# Patient Record
Sex: Female | Born: 1990
Health system: Southern US, Community
[De-identification: ages and names within clinical notes are randomized; demographics above are authoritative.]

## PROBLEM LIST (undated history)

## (undated) DIAGNOSIS — N912 Amenorrhea, unspecified: Secondary | ICD-10-CM

## (undated) DIAGNOSIS — N946 Dysmenorrhea, unspecified: Secondary | ICD-10-CM

## (undated) HISTORY — DX: Amenorrhea, unspecified: N91.2

## (undated) HISTORY — DX: Dysmenorrhea, unspecified: N94.6

---

## 2017-02-15 DIAGNOSIS — H109 Unspecified conjunctivitis: Secondary | ICD-10-CM | POA: Diagnosis not present

## 2018-02-17 ENCOUNTER — Telehealth: Payer: Self-pay | Admitting: Medical

## 2018-02-17 ENCOUNTER — Telehealth: Payer: Self-pay

## 2018-02-17 ENCOUNTER — Ambulatory Visit: Payer: BLUE CROSS/BLUE SHIELD | Admitting: Medical

## 2018-02-17 ENCOUNTER — Encounter: Payer: Self-pay | Admitting: Medical

## 2018-02-17 VITALS — BP 121/73 | HR 85 | Temp 98.2°F | Resp 16 | Ht 68.0 in | Wt 260.0 lb

## 2018-02-17 DIAGNOSIS — Z3009 Encounter for other general counseling and advice on contraception: Secondary | ICD-10-CM | POA: Diagnosis not present

## 2018-02-17 DIAGNOSIS — L732 Hidradenitis suppurativa: Secondary | ICD-10-CM

## 2018-02-17 LAB — POCT URINE PREGNANCY: Preg Test, Ur: NEGATIVE

## 2018-02-17 MED ORDER — SULFAMETHOXAZOLE-TRIMETHOPRIM 800-160 MG PO TABS: 1.0000 | ORAL_TABLET | Freq: Two times a day (BID) | ORAL | 0 refills | Status: DC

## 2018-02-17 MED ORDER — MUPIROCIN 2 % EX OINT: TOPICAL_OINTMENT | CUTANEOUS | 0 refills | Status: DC

## 2018-02-17 MED ORDER — NORETHINDRONE-ETH ESTRADIOL 1-35 MG-MCG PO TABS: 1.0000 | ORAL_TABLET | Freq: Every day | ORAL | 11 refills | Status: DC

## 2018-02-17 NOTE — Patient Instructions (Signed)
For family planning/desire to be on contraceptives, I did prescribe Ortho Novum.  Your blood pressure is well controlled today, you do not smoke and no history of any DVTs.  Extra strongly advised not to start smoking as sometimes this can predispose to getting DVTs.  I will go ahead and refer you to gynecologist for Pap smear.  You expressed some concern for clinical picture of PCOS.  Gynecologist may go ahead and do ultrasound when Pap smear done.  History of hidradenitis supparativa.  Small area appears present left axillary area presently.  I will prescribe Bactrim DS  Oral antibiotic for 7 days.  Did go ahead and write you for mupirocin  topical antibiotic to use for future flares early on.  Would asked that you go ahead and schedule for complete physical exam with me and come in fasting for 8 hours.  Recommend early 8 AM appointment.  You could try to get that scheduled within the next 2 to 3 weeks.

## 2018-02-17 NOTE — Telephone Encounter (Signed)
Copied from CRM [id number]. Topic: General - Other >> Feb 17, 2018  8:33 AM Burchel, Abbi R wrote: Reason for CRM: Pt states she is runnign 5-7 min late dur to traffic.

## 2018-02-17 NOTE — Addendum Note (Signed)
Addended by: Orlene OchRENCE, [place] N on: 02/17/2018 11:02 AM   Modules accepted: Orders

## 2018-02-17 NOTE — Progress Notes (Signed)
Subjective:    Patient ID: Kathleen Harding, female    DOB: Jul 07, 1990, 27 y.o.   MRN: [id number]  HPI  Pt in for first time.  Pt works Marketing executive x 4 years. Pt does not exercise. No caffeine. Admits does not eat healthy. Non smoker. Alcohol 2 drinks of wine every 2 weeks.  Pt does have h supparativa. States happens 2-3 times a month. When on birth control when younger had less flares. Pt recently has small broken down area left axillary area. Small bump over past week.  LMP- had 2 months. She does have history of irregular cycles. Hx of cycles every 2-3 months in past.(Pt states not sexually active presently. But has been before). She request to be on ocp. No history of DVT. Non smoker. No hx of htn.  Pt states she had pap smear more than 3 years ago. She can't remember if it was normal.     Review of Systems  Constitutional: Negative for chills, fatigue and fever.  HENT: Negative for congestion, drooling, facial swelling, mouth sores, postnasal drip, rhinorrhea and sinus pain.   Respiratory: Negative for cough, chest tightness, shortness of breath and wheezing.   Cardiovascular: Negative for chest pain and palpitations.  Gastrointestinal: Negative for abdominal pain.  Genitourinary: Negative for difficulty urinating, dysuria, frequency, hematuria and urgency.  Musculoskeletal: Negative for back pain, joint swelling and myalgias.  Skin:       See hpi.  Neurological: Negative for dizziness, weakness, light-headedness, numbness and headaches.  Hematological: Negative for adenopathy. Does not bruise/bleed easily.  Psychiatric/Behavioral: Negative for behavioral problems, confusion and dysphoric mood.    No past medical history on file.   Social History   Socioeconomic History  . Marital status: Single    Spouse name: Not on file  . Number of children: Not on file  . Years of education: Not on file  . Highest education level: Not on file  Occupational History  . Not on file    Social Needs  . Financial resource strain: Not on file  . Food insecurity:    Worry: Not on file    Inability: Not on file  . Transportation needs:    Medical: Not on file    Non-medical: Not on file  Tobacco Use  . Smoking status: Never Smoker  . Smokeless tobacco: Never Used  Substance and Sexual Activity  . Alcohol use: Yes  . Drug use: Never  . Sexual activity: Not on file  Lifestyle  . Physical activity:    Days per week: Not on file    Minutes per session: Not on file  . Stress: Not on file  Relationships  . Social connections:    Talks on phone: Not on file    Gets together: Not on file    Attends religious service: Not on file    Active member of club or organization: Not on file    Attends meetings of clubs or organizations: Not on file    Relationship status: Not on file  . Intimate partner violence:    Fear of current or ex partner: Not on file    Emotionally abused: Not on file    Physically abused: Not on file    Forced sexual activity: Not on file  Other Topics Concern  . Not on file  Social History Narrative  . Not on file      No family history on file.  Not on File  No current outpatient medications on file prior  to visit.   No current facility-administered medications on file prior to visit.     There were no vitals taken for this visit.      Objective:   Physical Exam  General Mental Status- Alert. General Appearance- Not in acute distress.   Skin Left axillary area. Small raised are present. Central portion 6 mm area of epidermis broken down. No dc presently. Faint indurated.  Neck Carotid Arteries- Normal color. Moisture- Normal Moisture. No carotid bruits. No JVD.  Chest and Lung Exam Auscultation: Breath Sounds:-Normal.  Cardiovascular Auscultation:Rythm- Regular. Murmurs & Other Heart Sounds:Auscultation of the heart reveals- No Murmurs.  Abdomen Inspection:-Inspeection Normal. Palpation/Percussion:Note:No mass.  Palpation and Percussion of the abdomen reveal- Non Tender, Non Distended + BS, no rebound or guarding.   Neurologic Cranial Nerve exam:- CN III-XII intact(No nystagmus), symmetric smile. Strength:- 5/5 equal and symmetric strength both upper and lower extremities.      Assessment & Plan:  For family planning/desire to be on contraceptives, I did prescribe Ortho Novum.  Your blood pressure is well controlled today, you do not smoke and no history of any DVTs.  Extra strongly advised not to start smoking as sometimes this can predispose to getting DVTs.  I will go ahead and refer you to gynecologist for Pap smear.  You expressed some concern for clinical picture of PCOS.  Gynecologist may go ahead and do ultrasound when Pap smear done.  History of hidradenitis supparativa.  Small area appears present left axillary area presently.  I will prescribe Bactrim DS  Oral antibiotic for 7 days.  Did go ahead and write you for mupirocin  topical antibiotic to use for future flares early on.  Would asked that you go ahead and schedule for complete physical exam with me and come in fasting for 8 hours.  Recommend early 8 AM appointment.  You could try to get that scheduled within the next 2 to 3 weeks.  Esperanza RichtersEdward Bensyn Bornemann, PA-C

## 2018-02-17 NOTE — Telephone Encounter (Signed)
Please result pt negative urine pregnancy

## 2018-04-07 ENCOUNTER — Encounter: Payer: Self-pay | Admitting: *Deleted

## 2018-04-07 ENCOUNTER — Ambulatory Visit (INDEPENDENT_AMBULATORY_CARE_PROVIDER_SITE_OTHER): Payer: BLUE CROSS/BLUE SHIELD | Admitting: Family Medicine

## 2018-04-07 ENCOUNTER — Encounter: Payer: Self-pay | Admitting: Family Medicine

## 2018-04-07 VITALS — BP 122/80 | HR 81 | Temp 98.4°F | Ht 68.0 in | Wt 254.8 lb

## 2018-04-07 DIAGNOSIS — K529 Noninfective gastroenteritis and colitis, unspecified: Secondary | ICD-10-CM | POA: Diagnosis not present

## 2018-04-07 DIAGNOSIS — R197 Diarrhea, unspecified: Secondary | ICD-10-CM | POA: Diagnosis not present

## 2018-04-07 DIAGNOSIS — R112 Nausea with vomiting, unspecified: Secondary | ICD-10-CM | POA: Diagnosis not present

## 2018-04-07 MED ORDER — ONDANSETRON 4 MG PO TBDP: 4.0000 mg | ORAL_TABLET | Freq: Three times a day (TID) | ORAL | 0 refills | Status: DC | PRN

## 2018-04-07 NOTE — Progress Notes (Signed)
HPI:  Using dictation device. Unfortunately this device frequently misinterprets words/phrases.  Acute visit for N/V: -started acutely today -waves of nausea with emesis (nonbilious, non bloody) x 2 -one episode watery to loose diarrhea - ? Streak of blood on tp - no large amount of blood in toilet or stool or TP -no treatment -no recent travel, different foods, fevers, abd pain, SOB, dizziness -no improving -FDLMP 03/22/18  ROS: See pertinent positives and negatives per HPI.  History reviewed. No pertinent past medical history.  History reviewed. No pertinent surgical history.  History reviewed. No pertinent family history.  SOCIAL HX: see hpi   Current Outpatient Medications:  .  mupirocin ointment (BACTROBAN) 2 %, Apply thin film twice daily, Disp: 22 g, Rfl: 0 .  norethindrone-ethinyl estradiol 1/35 (ORTHO-NOVUM, NORTREL,CYCLAFEM) tablet, Take 1 tablet by mouth daily., Disp: 1 Package, Rfl: 11  EXAM:  Vitals:   04/07/18 1451  BP: 122/80  Pulse: 81  Temp: 98.4 F (36.9 C)  SpO2: 98%    Body mass index is 38.74 kg/m.  GENERAL: vitals reviewed and listed above, alert, oriented, appears well hydrated and in no acute distress  HEENT: atraumatic, conjunttiva clear, no obvious abnormalities on inspection of external nose and ears  NECK: no obvious masses on inspection  LUNGS: clear to auscultation bilaterally, no wheezes, rales or rhonchi, good air movement  CV: HRRR, no peripheral edema  ABD: BS+ , soft, NTTP, no rebound or guarding  MS: moves all extremities without noticeable abnormality  PSYCH: pleasant and cooperative, no obvious depression or anxiety  ASSESSMENT AND PLAN:  Discussed the following assessment and plan:  Gastroenteritis  Nausea and vomiting, intractability of vomiting not specified, unspecified vomiting type  Diarrhea, unspecified type  -we discussed possible serious and likely etiologies, workup and treatment, treatment risks and  return precautions - suspect mild gastroenteritis -after this discussion, Brittinie opted for symptomatic care, oral rehydration -follow up advised as needed -of course, we advised Weslynn  to return or notify a doctor immediately if symptoms worsen or persist or new concerns arise. She is to seek care promptly if any bleeding.    Patient Instructions  BEFORE YOU LEAVE: -work note - do not return to work until no vomiting or diarrhea for 24 hours   imodium per instructions as needed for diarrhea.  zofran per instructions as needed for nausea and vomiting.  I hope you are feeling better soon! Seek care promptly if your symptoms worsen, new concerns arise or you are not improving with treatment.   Viral Gastroenteritis, Adult  Viral gastroenteritis is also known as the stomach flu. This condition is caused by certain germs (viruses). These germs can be passed from person to person very easily (are very contagious). This condition can cause sudden watery poop (diarrhea), fever, and throwing up (vomiting). Having watery poop and throwing up can make you feel weak and cause you to get dehydrated. Dehydration can make you tired and thirsty, make you have a dry mouth, and make it so you pee (urinate) less often. Older adults and people with other diseases or a weak defense system (immune system) are at higher risk for dehydration. It is important to replace the fluids that you lose from having watery poop and throwing up. Follow these instructions at home: Follow instructions from your doctor about how to care for yourself at home. Eating and drinking Follow these instructions as told by your doctor:  Take an oral rehydration solution (ORS). This is a drink that is  sold at pharmacies and stores.  Drink clear fluids in small amounts as you are able, such as: ? Water. ? Ice chips. ? Diluted fruit juice. ? Low-calorie sports drinks.  Eat bland, easy-to-digest foods in small amounts as you are  able, such as: ? Bananas. ? Applesauce. ? Rice. ? Low-fat (lean) meats. ? Toast. ? Crackers.  Avoid fluids that have a lot of sugar or caffeine in them.  Avoid alcohol.  Avoid spicy or fatty foods. General instructions   Drink enough fluid to keep your pee (urine) clear or pale yellow.  Wash your hands often. If you cannot use soap and water, use hand sanitizer.  Make sure that all people in your home wash their hands well and often.  Rest at home while you get better.  Take over-the-counter and prescription medicines only as told by your doctor.  Watch your condition for any changes.  Take a warm bath to help with any burning or pain from having watery poop.  Keep all follow-up visits as told by your doctor. This is important. Contact a doctor if:  You cannot keep fluids down.  Your symptoms get worse.  You have new symptoms.  You feel light-headed or dizzy.  You have muscle cramps. Get help right away if:  You have chest pain.  You feel very weak or you pass out (faint).  You see blood in your throw-up.  Your throw-up looks like coffee grounds.  You have bloody or black poop (stools) or poop that look like tar.  You have a very bad headache, a stiff neck, or both.  You have a rash.  You have very bad pain, cramping, or bloating in your belly (abdomen).  You have trouble breathing.  You are breathing very quickly.  Your heart is beating very quickly.  Your skin feels cold and clammy.  You feel confused.  You have pain when you pee.  You have signs of dehydration, such as: ? Dark pee, hardly any pee, or no pee. ? Cracked lips. ? Dry mouth. ? Sunken eyes. ? Sleepiness. ? Weakness. This information is not intended to replace advice given to you by your health care provider. Make sure you discuss any questions you have with your health care provider. Document Released: 08/14/2007 Document Revised: 11/19/2017 Document Reviewed:  11/01/2014 Elsevier Interactive Patient Education  2019 [employer]    Terressa KoyanagiHannah R Marguarite Markov, DO

## 2018-04-07 NOTE — Patient Instructions (Signed)
BEFORE YOU LEAVE: -work note - do not return to work until no vomiting or diarrhea for 24 hours   imodium per instructions as needed for diarrhea.  zofran per instructions as needed for nausea and vomiting.  I hope you are feeling better soon! Seek care promptly if your symptoms worsen, new concerns arise or you are not improving with treatment.   Viral Gastroenteritis, Adult  Viral gastroenteritis is also known as the stomach flu. This condition is caused by certain germs (viruses). These germs can be passed from person to person very easily (are very contagious). This condition can cause sudden watery poop (diarrhea), fever, and throwing up (vomiting). Having watery poop and throwing up can make you feel weak and cause you to get dehydrated. Dehydration can make you tired and thirsty, make you have a dry mouth, and make it so you pee (urinate) less often. Older adults and people with other diseases or a weak defense system (immune system) are at higher risk for dehydration. It is important to replace the fluids that you lose from having watery poop and throwing up. Follow these instructions at home: Follow instructions from your doctor about how to care for yourself at home. Eating and drinking Follow these instructions as told by your doctor:  Take an oral rehydration solution (ORS). This is a drink that is sold at pharmacies and stores.  Drink clear fluids in small amounts as you are able, such as: ? Water. ? Ice chips. ? Diluted fruit juice. ? Low-calorie sports drinks.  Eat bland, easy-to-digest foods in small amounts as you are able, such as: ? Bananas. ? Applesauce. ? Rice. ? Low-fat (lean) meats. ? Toast. ? Crackers.  Avoid fluids that have a lot of sugar or caffeine in them.  Avoid alcohol.  Avoid spicy or fatty foods. General instructions   Drink enough fluid to keep your pee (urine) clear or pale yellow.  Wash your hands often. If you cannot use soap and  water, use hand sanitizer.  Make sure that all people in your home wash their hands well and often.  Rest at home while you get better.  Take over-the-counter and prescription medicines only as told by your doctor.  Watch your condition for any changes.  Take a warm bath to help with any burning or pain from having watery poop.  Keep all follow-up visits as told by your doctor. This is important. Contact a doctor if:  You cannot keep fluids down.  Your symptoms get worse.  You have new symptoms.  You feel light-headed or dizzy.  You have muscle cramps. Get help right away if:  You have chest pain.  You feel very weak or you pass out (faint).  You see blood in your throw-up.  Your throw-up looks like coffee grounds.  You have bloody or black poop (stools) or poop that look like tar.  You have a very bad headache, a stiff neck, or both.  You have a rash.  You have very bad pain, cramping, or bloating in your belly (abdomen).  You have trouble breathing.  You are breathing very quickly.  Your heart is beating very quickly.  Your skin feels cold and clammy.  You feel confused.  You have pain when you pee.  You have signs of dehydration, such as: ? Dark pee, hardly any pee, or no pee. ? Cracked lips. ? Dry mouth. ? Sunken eyes. ? Sleepiness. ? Weakness. This information is not intended to replace advice given to  you by your health care provider. Make sure you discuss any questions you have with your health care provider. Document Released: 08/14/2007 Document Revised: 11/19/2017 Document Reviewed: 11/01/2014 Elsevier Interactive Patient Education  2019 [employer]

## 2018-07-10 ENCOUNTER — Other Ambulatory Visit: Payer: Self-pay | Admitting: Medical

## 2018-07-29 ENCOUNTER — Other Ambulatory Visit: Payer: Self-pay

## 2018-07-29 ENCOUNTER — Ambulatory Visit (INDEPENDENT_AMBULATORY_CARE_PROVIDER_SITE_OTHER): Payer: BLUE CROSS/BLUE SHIELD | Admitting: Medical

## 2018-07-29 ENCOUNTER — Encounter: Payer: Self-pay | Admitting: Medical

## 2018-07-29 ENCOUNTER — Telehealth: Payer: Self-pay | Admitting: Medical

## 2018-07-29 DIAGNOSIS — R51 Headache: Secondary | ICD-10-CM | POA: Diagnosis not present

## 2018-07-29 DIAGNOSIS — R519 Headache, unspecified: Secondary | ICD-10-CM

## 2018-07-29 LAB — SEDIMENTATION RATE: Sed Rate: 24 mm/hr — ABNORMAL HIGH (ref 0–20)

## 2018-07-29 MED ORDER — KETOROLAC TROMETHAMINE 60 MG/2ML IM SOLN
60.0000 mg | Freq: Once | INTRAMUSCULAR | Status: AC
  Administered 2018-07-29: 14:00:00 60 mg via INTRAMUSCULAR

## 2018-07-29 MED ORDER — PREDNISONE 10 MG (21) PO TBPK: ORAL_TABLET | ORAL | 0 refills | Status: DC

## 2018-07-29 NOTE — Patient Instructions (Addendum)
Patient has headache recently with most of her pain described in the temporal area right side.  She has good neurologic exam on today's a video visit.  Disadvantage in that I cannot look inside her right ear canal.  Often unable to view dentition/teeth.  No obvious recent allergy symptoms and no proceeding symptoms indicating sinus infection.  That this is in the differential.  Also considered possible early shingles and she does have some pain trigeminal distribution.  Presently advised patient to come in at 145 this afternoon for a stat sed rate.  Nurse will check patient's blood pressure.  If blood pressure is reasonably controlled will probably give Toradol 30 or 60 mg IM injection.  Plan to follow the sed rate and if it is elevated will prescribe prednisone.(also considering migaine in diff dx as some light sensitivity)  If sed rate is not elevated then will assess patient's response to the Toradol.  If she has worsening or changing signs symptoms and will advise emergency department evaluation.  She does have some improvement but not complete improvement then might prescribe Fioricet.  Did explain the patient to watch for any rash or vesicle eruption on her face.(in event of skin erupution would rx antiviral)    If she has worsening ear pain or sinus region pain might consider even antibiotic.  Follow-up in 7 days or as needed.

## 2018-07-29 NOTE — Telephone Encounter (Signed)
Rx prednisone sent to pt pharmacy. 

## 2018-07-29 NOTE — Addendum Note (Signed)
Addended by: Orlene Och on: 07/29/2018 02:03 PM   Modules accepted: Orders

## 2018-07-29 NOTE — Progress Notes (Signed)
Subjective:    Patient ID: Kathleen Harding, female    DOB: Dec 31, 1990, 28 y.o.   MRN: [id number]  HPI  Virtual Visit via Video Note  I connected with Kathleen Harding on 07/29/18 at 11:20 AM EDT by a video enabled telemedicine application and verified that I am speaking with the correct person using two identifiers.  Location: Patient: home Provider: home.   I discussed the limitations of evaluation and management by telemedicine and the availability of in person appointments. The patient expressed understanding and agreed to proceed.  LMP- 07-18-2018.  Patient did not check her blood pressure today.  She does not have electronic blood pressure cuff.   History of Present Illness: Patient reports 3 days of right-sided headache.  She reports some pain in her temporal area, right side of neck/trapezius area and the jaw.  She also reports some mild ear pain and sinus region pain.  The pain is mostly in the temporal area.  She is not having any rashes or blister outbreak on her face.  She does have some slight light sensitivity.  No history of any chronic headaches in the past.  No history of any migraines.  No obvious allergy symptoms recently except for occasional sneeze.  She reports some faint pain in her right upper and lower teeth.  She describes that she has good dentition.  No obvious caries.  She has a little bit of pain in her teeth even when she is not chewing.   Observations/Objective: General no acute distress.  Pleasant.  Speech is normal.  Oriented. Lungs- breathing appears unlabored. Neuro-cranial nerves III through XII grossly intact.  No gross motor deficits on viewing patient performed movements.  She has good finger-to-nose.  Symmetric smile.  No drift.  Negative Homans sign.  Heel-to-toe gait intact. Skin-on inspection of her face it looks normal.  There is no rash or vesicles on her face. HEENT- she reports pain tenderness to palpation over the maxillary sinus area.  Also just  right of the bridge of her nose faintly tender.  Upon palpation of her right nares she does not report pain.  Right temporal area moderately tender to palpation when she presses her finger against area.  Assessment and Plan: Patient has headache recently with most of her pain described in the temporal area right side.  She has good neurologic exam on today's a video visit.  Disadvantage in that I cannot look inside her right ear canal.  Often unable to view dentition/teeth.  No obvious recent allergy symptoms and no proceeding symptoms indicating sinus infection.  That this is in the differential.  Also considered possible early shingles and she does have some pain trigeminal distribution.  Presently advised patient to come in at 145 this afternoon for a stat sed rate.  Nurse will check patient's blood pressure.  If blood pressure is reasonably controlled will probably give Toradol 30 or 60 mg IM injection.  Plan to follow the sed rate and if it is elevated will prescribe prednisone.  If sed rate is not elevated then will assess patient's response to the Toradol.  If she has worsening or changing signs symptoms and will advise emergency department evaluation.  She does have some improvement but not complete improvement then might prescribe Fioricet.  Did explain the patient to watch for any rash or vesicle eruption on her face.(in event of skin erupution would rx antiviral)    If she has worsening ear pain or sinus region pain might consider even  antibiotic.  Follow-up in 7 days or as needed.  25 minutes spent with patient.  50% of time counseling patient on plan going forward and differential diagnosis.  Answered patient's questions.  Follow Up Instructions:    I discussed the assessment and treatment plan with the patient. The patient was provided an opportunity to ask questions and all were answered. The patient agreed with the plan and demonstrated an understanding of the instructions.    The patient was advised to call back or seek an in-person evaluation if the symptoms worsen or if the condition fails to improve as anticipated.     Esperanza RichtersEdward Feliz Lincoln, PA-C   Review of Systems  Constitutional: Negative for chills and fever.  HENT: Positive for sinus pressure and sore throat. Negative for congestion, ear pain, facial swelling, nosebleeds, postnasal drip and trouble swallowing.   Eyes: Positive for photophobia. Negative for pain and visual disturbance.  Respiratory: Negative for cough, chest tightness, shortness of breath and wheezing.   Cardiovascular: Negative for chest pain and palpitations.  Gastrointestinal: Negative for abdominal pain.  Musculoskeletal: Negative for back pain, gait problem, myalgias and neck stiffness.  Skin: Negative for rash and wound.  Neurological: Positive for light-headedness. Negative for dizziness, tremors and weakness.  Hematological: Negative for adenopathy. Does not bruise/bleed easily.  Psychiatric/Behavioral: Negative for behavioral problems and confusion. The patient is not nervous/anxious and is not hyperactive.        Objective:   Physical Exam        Assessment & Plan:

## 2018-07-31 ENCOUNTER — Encounter: Payer: Self-pay | Admitting: Medical

## 2018-11-04 ENCOUNTER — Other Ambulatory Visit: Payer: Self-pay | Admitting: Medical

## 2018-11-11 ENCOUNTER — Other Ambulatory Visit: Payer: Self-pay

## 2018-11-13 ENCOUNTER — Encounter: Payer: Self-pay | Admitting: Family Medicine

## 2018-11-13 ENCOUNTER — Other Ambulatory Visit (HOSPITAL_COMMUNITY)
Admission: RE | Admit: 2018-11-13 | Discharge: 2018-11-13 | Disposition: A | Discharge status: Home / Self Care | Payer: BC Managed Care – PPO | Source: Ambulatory Visit | Attending: Family Medicine | Admitting: Family Medicine

## 2018-11-13 ENCOUNTER — Other Ambulatory Visit: Payer: Self-pay

## 2018-11-13 ENCOUNTER — Ambulatory Visit: Payer: BLUE CROSS/BLUE SHIELD | Admitting: Medical

## 2018-11-13 ENCOUNTER — Ambulatory Visit (INDEPENDENT_AMBULATORY_CARE_PROVIDER_SITE_OTHER): Payer: BC Managed Care – PPO | Admitting: Family Medicine

## 2018-11-13 VITALS — BP 110/80 | HR 82 | Temp 97.9°F | Resp 18 | Ht 68.0 in | Wt 246.2 lb

## 2018-11-13 DIAGNOSIS — Z113 Encounter for screening for infections with a predominantly sexual mode of transmission: Secondary | ICD-10-CM

## 2018-11-13 NOTE — Patient Instructions (Signed)

## 2018-11-13 NOTE — Progress Notes (Signed)
Patient ID: Kathleen Harding, female    DOB: 1991/02/25  Age: 28 y.o. MRN: [id number]    Subjective:  Subjective  HPI Kathleen Harding presents for std testing.  She is having no symptoms   No uti symptoms  No vaginal d/c , sores.   No abd pain   Review of Systems  Constitutional: Negative for appetite change, diaphoresis, fatigue and unexpected weight change.  Eyes: Negative for pain, redness and visual disturbance.  Respiratory: Negative for cough, chest tightness, shortness of breath and wheezing.   Cardiovascular: Negative for chest pain, palpitations and leg swelling.  Endocrine: Negative for cold intolerance, heat intolerance, polydipsia, polyphagia and polyuria.  Genitourinary: Negative for difficulty urinating, dysuria and frequency.  Neurological: Negative for dizziness, light-headedness, numbness and headaches.    History No past medical history on file.  She has no past surgical history on file.   Her family history is not on file.She reports that she has never smoked. She has never used smokeless tobacco. She reports current alcohol use. She reports that she does not use drugs.  Current Outpatient Medications on File Prior to Visit  Medication Sig Dispense Refill  . mupirocin ointment (BACTROBAN) 2 % Apply thin film twice daily 22 g 0  . ALAYCEN 1/35 tablet TAKE 1 TABLET BY MOUTH EVERY DAY (Patient not taking: Reported on 11/13/2018) 84 tablet 3   No current facility-administered medications on file prior to visit.      Objective:  Objective  Physical Exam Vitals signs and nursing note reviewed.  Constitutional:      General: She is not in acute distress.    Appearance: Normal appearance. She is obese. She is not ill-appearing, toxic-appearing or diaphoretic.  Neurological:     Mental Status: She is alert.  Psychiatric:        Mood and Affect: Mood normal.        Behavior: Behavior normal.        Thought Content: Thought content normal.    BP 110/80 (BP Location:  Right Arm, Patient Position: Sitting, Cuff Size: Normal)   Pulse 82   Temp 97.9 F (36.6 C) (Temporal)   Resp 18   Ht 5\' 8"  (1.727 m)   Wt 246 lb 3.2 oz (111.7 kg)   LMP 10/26/2018   SpO2 100%   BMI 37.43 kg/m  Wt Readings from Last 3 Encounters:  11/13/18 246 lb 3.2 oz (111.7 kg)  04/07/18 254 lb 12.8 oz (115.6 kg)  02/17/18 260 lb (117.9 kg)     No results found for: WBC, HGB, HCT, PLT, GLUCOSE, CHOL, TRIG, HDL, LDLDIRECT, LDLCALC, ALT, AST, NA, K, CL, CREATININE, BUN, CO2, TSH, PSA, INR, GLUF, HGBA1C, MICROALBUR  Patient was never admitted.   Assessment & Plan:  Plan  I have discontinued Haidy L. Sturgess's ondansetron and predniSONE. I am also having her maintain her mupirocin ointment and ALAYCEN 1/35.  No orders of the defined types were placed in this encounter.   Problem List Items Addressed This Visit    None    Visit Diagnoses    Screening examination for STD (sexually transmitted disease)    -  Primary   Relevant Orders   Urine cytology ancillary only([place])   HIV antibody (with reflex)   RPR   HSV Type I/II IgG, IgMw/ reflex   Hepatitis C antibody   Hepatitis B surface antibody,quantitative    std screening ordered avs given to pt  rto prn   Follow-up: Return if symptoms  worsen or fail to improve.  Ann Held, DO

## 2018-11-14 LAB — URINE CYTOLOGY ANCILLARY ONLY
Chlamydia: NEGATIVE
Neisseria Gonorrhea: NEGATIVE
Trichomonas: NEGATIVE

## 2018-11-17 LAB — HEPATITIS B SURFACE ANTIBODY, QUANTITATIVE: Hep B S AB Quant (Post): <5 m[IU]/mL — ABNORMAL LOW (ref >=10)

## 2018-11-17 LAB — HEPATITIS C ANTIBODY
Hepatitis C Ab: NONREACTIVE
SIGNAL TO CUT-OFF: 0.52 (ref <1.00)

## 2018-11-17 LAB — HIV ANTIBODY (ROUTINE TESTING W REFLEX): HIV 1&2 Ab, 4th Generation: NONREACTIVE

## 2018-11-17 LAB — RPR: RPR Ser Ql: NONREACTIVE

## 2018-11-18 LAB — HSV TYPE I/II IGG, IGMW/ REFLEX
HSV 1 Glycoprotein G Ab, IgG: <0.91 index (ref 0.00–0.90)
HSV 1 IgM: <1:10 {titer}
HSV 2 IgG, Type Spec: <0.91 index (ref 0.00–0.90)
HSV 2 IgM: <1:10 {titer}

## 2018-11-19 LAB — URINE CYTOLOGY ANCILLARY ONLY
Bacterial vaginitis: POSITIVE — AB
Candida vaginitis: NEGATIVE

## 2018-11-27 ENCOUNTER — Other Ambulatory Visit: Payer: Self-pay | Admitting: Family Medicine

## 2018-11-27 ENCOUNTER — Encounter: Payer: Self-pay | Admitting: Family Medicine

## 2018-11-27 DIAGNOSIS — B9689 Other specified bacterial agents as the cause of diseases classified elsewhere: Secondary | ICD-10-CM

## 2018-11-27 DIAGNOSIS — N760 Acute vaginitis: Secondary | ICD-10-CM

## 2018-11-27 MED ORDER — METRONIDAZOLE 500 MG PO TABS: 500.0000 mg | ORAL_TABLET | Freq: Two times a day (BID) | ORAL | 0 refills | Status: DC

## 2018-11-27 NOTE — Telephone Encounter (Signed)
Flagyl 500mg  bid x 7 days

## 2018-11-30 ENCOUNTER — Telehealth: Payer: Self-pay

## 2018-11-30 NOTE — Telephone Encounter (Signed)
Copied from [place] [id number]. Topic: General - Other >> Nov 27, 2018  9:29 AM Keene Breath wrote: Reason for CRM: Patient called to ask for medication for her positive bacteria infection that shows on My Chart.  Patient would like the nurse to call her to discuss.  Please call patient at [phone]

## 2019-01-25 ENCOUNTER — Telehealth: Payer: Self-pay | Admitting: Medical

## 2019-01-25 MED ORDER — MUPIROCIN 2 % EX OINT: TOPICAL_OINTMENT | CUTANEOUS | 0 refills | Status: DC

## 2019-01-25 NOTE — Telephone Encounter (Signed)
Sent in mupirocin. But offer appointment as well. She may need oral antibiotic on review of her hx.

## 2019-01-25 NOTE — Telephone Encounter (Signed)
Medication Refill - Medication: mupirocin ointment (BACTROBAN) 2 %   Has the patient contacted their pharmacy? No. (Agent: If no, request that the patient contact the pharmacy for the refill.) (Agent: If yes, when and what did the pharmacy advise?)  Preferred Pharmacy (with phone number or street name):  [id number] - [place], [place] [phone] (Phone) [phone] (Fax)     Agent: Please be advised that RX refills may take up to 3 business days. We ask that you follow-up with your pharmacy.

## 2019-01-26 NOTE — Telephone Encounter (Signed)
Left pt a message to call back to schedule appointment.  

## 2019-06-04 ENCOUNTER — Encounter: Payer: Self-pay | Admitting: Obstetrics and Gynecology

## 2019-06-04 ENCOUNTER — Other Ambulatory Visit: Payer: Self-pay

## 2019-06-04 ENCOUNTER — Other Ambulatory Visit (HOSPITAL_COMMUNITY)
Admission: RE | Admit: 2019-06-04 | Discharge: 2019-06-04 | Disposition: A | Discharge status: Home / Self Care | Payer: BC Managed Care – PPO | Source: Ambulatory Visit | Attending: Obstetrics and Gynecology | Admitting: Obstetrics and Gynecology

## 2019-06-04 ENCOUNTER — Ambulatory Visit (INDEPENDENT_AMBULATORY_CARE_PROVIDER_SITE_OTHER): Payer: 59 | Admitting: Obstetrics and Gynecology

## 2019-06-04 ENCOUNTER — Encounter: Payer: 59 | Admitting: Obstetrics and Gynecology

## 2019-06-04 VITALS — BP 118/64 | HR 88 | Temp 97.1°F | Ht 68.0 in | Wt 247.0 lb

## 2019-06-04 DIAGNOSIS — Z6837 Body mass index (BMI) 37.0-37.9, adult: Secondary | ICD-10-CM | POA: Diagnosis not present

## 2019-06-04 DIAGNOSIS — Z0000 Encounter for general adult medical examination without abnormal findings: Secondary | ICD-10-CM

## 2019-06-04 DIAGNOSIS — Z01419 Encounter for gynecological examination (general) (routine) without abnormal findings: Secondary | ICD-10-CM

## 2019-06-04 DIAGNOSIS — Z872 Personal history of diseases of the skin and subcutaneous tissue: Secondary | ICD-10-CM

## 2019-06-04 DIAGNOSIS — Z124 Encounter for screening for malignant neoplasm of cervix: Secondary | ICD-10-CM | POA: Insufficient documentation

## 2019-06-04 DIAGNOSIS — N3944 Nocturnal enuresis: Secondary | ICD-10-CM

## 2019-06-04 DIAGNOSIS — N926 Irregular menstruation, unspecified: Secondary | ICD-10-CM | POA: Insufficient documentation

## 2019-06-04 DIAGNOSIS — N913 Primary oligomenorrhea: Secondary | ICD-10-CM

## 2019-06-04 DIAGNOSIS — Z7189 Other specified counseling: Secondary | ICD-10-CM

## 2019-06-04 DIAGNOSIS — L680 Hirsutism: Secondary | ICD-10-CM

## 2019-06-04 DIAGNOSIS — E282 Polycystic ovarian syndrome: Secondary | ICD-10-CM | POA: Insufficient documentation

## 2019-06-04 DIAGNOSIS — Z3009 Encounter for other general counseling and advice on contraception: Secondary | ICD-10-CM

## 2019-06-04 DIAGNOSIS — N39498 Other specified urinary incontinence: Secondary | ICD-10-CM

## 2019-06-04 DIAGNOSIS — Z7185 Encounter for immunization safety counseling: Secondary | ICD-10-CM

## 2019-06-04 LAB — POCT URINE PREGNANCY: Preg Test, Ur: NEGATIVE

## 2019-06-04 MED ORDER — DROSPIRENONE-ETHINYL ESTRADIOL 3-0.02 MG PO TABS: 1.0000 | ORAL_TABLET | Freq: Every day | ORAL | 0 refills | Status: DC

## 2019-06-04 NOTE — Patient Instructions (Addendum)
Check if you have had TDAP and gardasil immunizations   Kegel Exercises  Kegel exercises can help strengthen your pelvic floor muscles. The pelvic floor is a group of muscles that support your rectum, small intestine, and bladder. In females, pelvic floor muscles also help support the womb (uterus). These muscles help you control the flow of urine and stool. Kegel exercises are painless and simple, and they do not require any equipment. Your provider may suggest Kegel exercises to:  Improve bladder and bowel control.  Improve sexual response.  Improve weak pelvic floor muscles after surgery to remove the uterus (hysterectomy) or pregnancy (females).  Improve weak pelvic floor muscles after prostate gland removal or surgery (males). Kegel exercises involve squeezing your pelvic floor muscles, which are the same muscles you squeeze when you try to stop the flow of urine or keep from passing gas. The exercises can be done while sitting, standing, or lying down, but it is best to vary your position. Exercises How to do Kegel exercises: 1. Squeeze your pelvic floor muscles tight. You should feel a tight lift in your rectal area. If you are a female, you should also feel a tightness in your vaginal area. Keep your stomach, buttocks, and legs relaxed. 2. Hold the muscles tight for up to 10 seconds. 3. Breathe normally. 4. Relax your muscles. 5. Repeat as told by your health care provider. Repeat this exercise daily as told by your health care provider. Continue to do this exercise for at least 4-6 weeks, or for as long as told by your health care provider. You may be referred to a physical therapist who can help you learn more about how to do Kegel exercises. Depending on your condition, your health care provider may recommend:  Varying how long you squeeze your muscles.  Doing several sets of exercises every day.  Doing exercises for several weeks.  Making Kegel exercises a part of your  regular exercise routine. This information is not intended to replace advice given to you by your health care provider. Make sure you discuss any questions you have with your health care provider. Document Revised: 10/15/2017 Document Reviewed: 10/15/2017 Elsevier Patient Education  [place] DIET:  We recommended that you start or continue a regular exercise program for good health. Regular exercise means any activity that makes your heart beat faster and makes you sweat.  We recommend exercising at least 30 minutes per day at least 3 days a week, preferably 4 or 5.  We also recommend a diet low in fat and sugar.  Inactivity, poor dietary choices and obesity can cause diabetes, heart attack, stroke, and kidney damage, among others.    ALCOHOL AND SMOKING:  Women should limit their alcohol intake to no more than 7 drinks/beers/glasses of wine (combined, not each!) per week. Moderation of alcohol intake to this level decreases your risk of breast cancer and liver damage. And of course, no recreational drugs are part of a healthy lifestyle.  And absolutely no smoking or even second hand smoke. Most people know smoking can cause heart and lung diseases, but did you know it also contributes to weakening of your bones? Aging of your skin?  Yellowing of your teeth and nails?  CALCIUM AND VITAMIN D:  Adequate intake of calcium and Vitamin D are recommended.  The recommendations for exact amounts of these supplements seem to change often, but generally speaking 1,000 mg of calcium (between diet and supplement) and 800 units  of Vitamin D per day seems prudent. Certain women may benefit from higher intake of Vitamin D.  If you are among these women, your doctor will have told you during your visit.    PAP SMEARS:  Pap smears, to check for cervical cancer or precancers,  have traditionally been done yearly, although recent scientific advances have shown that most women can have pap smears  less often.  However, every woman still should have a physical exam from her gynecologist every year. It will include a breast check, inspection of the vulva and vagina to check for abnormal growths or skin changes, a visual exam of the cervix, and then an exam to evaluate the size and shape of the uterus and ovaries.  And after 27 years of age, a rectal exam is indicated to check for rectal cancers. We will also provide age appropriate advice regarding health maintenance, like when you should have certain vaccines, screening for sexually transmitted diseases, bone density testing, colonoscopy, mammograms, etc.   MAMMOGRAMS:  All women over 41 years old should have a yearly mammogram. Many facilities now offer a "3D" mammogram, which may cost around $50 extra out of pocket. If possible,  we recommend you accept the option to have the 3D mammogram performed.  It both reduces the number of women who will be called back for extra views which then turn out to be normal, and it is better than the routine mammogram at detecting truly abnormal areas.    COLON CANCER SCREENING: Now recommend starting at age 69. At this time colonoscopy is not covered for routine screening until 50. There are take home tests that can be done between 45-49.   COLONOSCOPY:  Colonoscopy to screen for colon cancer is recommended for all women at age 11.  We know, you hate the idea of the prep.  We agree, BUT, having colon cancer and not knowing it is worse!!  Colon cancer so often starts as a polyp that can be seen and removed at colonscopy, which can quite literally save your life!  And if your first colonoscopy is normal and you have no family history of colon cancer, most women don't have to have it again for 10 years.  Once every ten years, you can do something that may end up saving your life, right?  We will be happy to help you get it scheduled when you are ready.  Be sure to check your insurance coverage so you understand how much  it will cost.  It may be covered as a preventative service at no cost, but you should check your particular policy.      Breast Self-Awareness Breast self-awareness means being familiar with how your breasts look and feel. It involves checking your breasts regularly and reporting any changes to your health care provider. Practicing breast self-awareness is important. A change in your breasts can be a sign of a serious medical problem. Being familiar with how your breasts look and feel allows you to find any problems early, when treatment is more likely to be successful. All women should practice breast self-awareness, including women who have had breast implants. How to do a breast self-exam One way to learn what is normal for your breasts and whether your breasts are changing is to do a breast self-exam. To do a breast self-exam: Look for Changes  1. Remove all the clothing above your waist. 2. Stand in front of a mirror in a room with good lighting. 3. Put your  hands on your hips. 4. Push your hands firmly downward. 5. Compare your breasts in the mirror. Look for differences between them (asymmetry), such as: ? Differences in shape. ? Differences in size. ? Puckers, dips, and bumps in one breast and not the other. 6. Look at each breast for changes in your skin, such as: ? Redness. ? Scaly areas. 7. Look for changes in your nipples, such as: ? Discharge. ? Bleeding. ? Dimpling. ? Redness. ? A change in position. Feel for Changes Carefully feel your breasts for lumps and changes. It is best to do this while lying on your back on the floor and again while sitting or standing in the shower or tub with soapy water on your skin. Feel each breast in the following way:  Place the arm on the side of the breast you are examining above your head.  Feel your breast with the other hand.  Start in the nipple area and make  inch (2 cm) overlapping circles to feel your breast. Use the pads of  your three middle fingers to do this. Apply light pressure, then medium pressure, then firm pressure. The light pressure will allow you to feel the tissue closest to the skin. The medium pressure will allow you to feel the tissue that is a little deeper. The firm pressure will allow you to feel the tissue close to the ribs.  Continue the overlapping circles, moving downward over the breast until you feel your ribs below your breast.  Move one finger-width toward the center of the body. Continue to use the  inch (2 cm) overlapping circles to feel your breast as you move slowly up toward your collarbone.  Continue the up and down exam using all three pressures until you reach your armpit.  Write Down What You Find  Write down what is normal for each breast and any changes that you find. Keep a written record with breast changes or normal findings for each breast. By writing this information down, you do not need to depend only on memory for size, tenderness, or location. Write down where you are in your menstrual cycle, if you are still menstruating. If you are having trouble noticing differences in your breasts, do not get discouraged. With time you will become more familiar with the variations in your breasts and more comfortable with the exam. How often should I examine my breasts? Examine your breasts every month. If you are breastfeeding, the best time to examine your breasts is after a feeding or after using a breast pump. If you menstruate, the best time to examine your breasts is 5-7 days after your period is over. During your period, your breasts are lumpier, and it may be more difficult to notice changes. When should I see my health care provider? See your health care provider if you notice:  A change in shape or size of your breasts or nipples.  A change in the skin of your breast or nipples, such as a reddened or scaly area.  Unusual discharge from your nipples.  A lump or thick  area that was not there before.  Pain in your breasts.  Anything that concerns you.   Oral Contraception Information Oral contraceptive pills (OCPs) are medicines taken to prevent pregnancy. OCPs are taken by mouth, and they work by:  Preventing the ovaries from releasing eggs.  Thickening mucus in the lower part of the uterus (cervix), which prevents sperm from entering the uterus.  Thinning the lining of  the uterus (endometrium), which prevents a fertilized egg from attaching to the endometrium. OCPs are highly effective when taken exactly as prescribed. However, OCPs do not prevent STIs (sexually transmitted infections). Safe sex practices, such as using condoms while on an OCP, can help prevent STIs. Before starting OCPs Before you start taking OCPs, you may have a physical exam, blood test, and Pap test. However, you are not required to have a pelvic exam in order to be prescribed OCPs. Your health care provider will make sure you are a good candidate for oral contraception. OCPs are not a good option for certain women, including women who smoke and are older than 35 years, and women with a medical history of high blood pressure, deep vein thrombosis, pulmonary embolism, stroke, cardiovascular disease, or peripheral vascular disease. Discuss with your health care provider the possible side effects of the OCP you may be prescribed. When you start an OCP, be aware that it can take 2-3 months for your body to adjust to changes in hormone levels. Follow instructions from your health care provider about how to start taking your first cycle of OCPs. Depending on when you start the pill, you may need to use a backup form of birth control, such as condoms, during the first week. Make sure you know what steps to take if you ever forget to take the pill. Types of oral contraception  The most common types of birth control pills contain the hormones estrogen and progestin (synthetic progesterone) or  progestin only. The combination pill This type of pill contains estrogen and progestin hormones. Combination pills often come in packs of 21, 28, or 91 pills. For each pack, the last 7 pills may not contain hormones, which means you may stop taking the pills for 7 days. Menstrual bleeding occurs during the week that you do not take the pills or that you take the pills with no hormones in them. The minipill This type of pill contains the progestin hormone only. It comes in packs of 28 pills. All 28 pills contain the hormone. You take the pill every day. It is very important to take the pill at the same time each day. Advantages of oral contraceptive pills  Provides reliable and continuous contraception if taken as instructed.  May treat or decrease symptoms of: ? Menstrual period cramps. ? Irregular menstrual cycle or bleeding. ? Heavy menstrual flow. ? Abnormal uterine bleeding. ? Acne, depending on the type of pill. ? Polycystic ovarian syndrome. ? Endometriosis. ? Iron deficiency anemia. ? Premenstrual symptoms, including premenstrual dysphoric disorder.  May reduce the risk of endometrial and ovarian cancer.  Can be used as emergency contraception.  Prevents mislocated (ectopic) pregnancies and infections of the fallopian tubes. Things that can make oral contraceptive pills less effective OCPs can be less effective if:  You forget to take the pill at the same time every day. This is especially important when taking the minipill.  You have a stomach or intestinal disease that reduces your body's ability to absorb the pill.  You take OCPs with other medicines that make OCPs less effective, such as antibiotics, certain HIV medicines, and some seizure medicines.  You take expired OCPs.  You forget to restart the pill on day 7, if using the packs of 21 pills. Risks associated with oral contraceptive pills Oral contraceptive pills can sometimes cause side effects, such  as:  Headache.  Depression.  Trouble sleeping.  Nausea and vomiting.  Breast tenderness.  Irregular bleeding or spotting  during the first several months.  Bloating or fluid retention.  Increase in blood pressure. Combination pills are also associated with a small increase in the risk of:  Blood clots.  Heart attack.  Stroke. Summary  Oral contraceptive pills are medicines taken by mouth to prevent pregnancy. They are highly effective when taken exactly as prescribed.  The most common types of birth control pills contain the hormones estrogen and progestin (synthetic progesterone) or progestin only.  Before you start taking the pill, you may have a physical exam, blood test, and Pap test. Your health care provider will make sure you are a good candidate for oral contraception.  The combination pill may come in a 21-day pack, a 28-day pack, or a 91-day pack. The minipill contains the progesterone hormone only and comes in packs of 28 pills.  Oral contraceptive pills can sometimes cause side effects, such as headache, nausea, breast tenderness, or irregular bleeding. This information is not intended to replace advice given to you by your health care provider. Make sure you discuss any questions you have with your health care provider. Document Revised: 02/07/2017 Document Reviewed: 05/21/2016 Elsevier Patient Education  2020 [employer] Inc.  Polycystic Ovarian Syndrome  Polycystic ovarian syndrome (PCOS) is a common hormonal disorder among women of reproductive age. In most women with PCOS, many small fluid-filled sacs (cysts) grow on the ovaries, and the cysts are not part of a normal menstrual cycle. PCOS can cause problems with your menstrual periods and make it difficult to get pregnant. It can also cause an increased risk of miscarriage with pregnancy. If it is not treated, PCOS can lead to serious health problems, such as diabetes and heart disease. What are the  causes? The cause of PCOS is not known, but it may be the result of a combination of certain factors, such as:  Irregular menstrual cycle.  High levels of certain hormones (androgens).  Problems with the hormone that helps to control blood sugar (insulin resistance).  Certain genes. What increases the risk? This condition is more likely to develop in women who have a family history of PCOS. What are the signs or symptoms? Symptoms of PCOS may include:  Multiple ovarian cysts.  Infrequent periods or no periods.  Periods that are too frequent or too heavy.  Unpredictable periods.  Inability to get pregnant (infertility) because of not ovulating.  Increased growth of hair on the face, chest, stomach, back, thumbs, thighs, or toes.  Acne or oily skin. Acne may develop during adulthood, and it may not respond to treatment.  Pelvic pain.  Weight gain or obesity.  Patches of thickened and dark brown or black skin on the neck, arms, breasts, or thighs (acanthosis nigricans).  Excess hair growth on the face, chest, abdomen, or upper thighs (hirsutism). How is this diagnosed? This condition is diagnosed based on:  Your medical history.  A physical exam, including a pelvic exam. Your health care provider may look for areas of increased hair growth on your skin.  Tests, such as: ? Ultrasound. This may be used to examine the ovaries and the lining of the uterus (endometrium) for cysts. ? Blood tests. These may be used to check levels of sugar (glucose), female hormone (testosterone), and female hormones (estrogen and progesterone) in your blood. How is this treated? There is no cure for PCOS, but treatment can help to manage symptoms and prevent more health problems from developing. Treatment varies depending on:  Your symptoms.  Whether you want to have  a baby or whether you need birth control (contraception). Treatment may include nutrition and lifestyle changes along  with:  Progesterone hormone to start a menstrual period.  Birth control pills to help you have regular menstrual periods.  Medicines to make you ovulate, if you want to get pregnant.  Medicine to reduce excessive hair growth.  Surgery, in severe cases. This may involve making small holes in one or both of your ovaries. This decreases the amount of testosterone that your body produces. Follow these instructions at home:  Take over-the-counter and prescription medicines only as told by your health care provider.  Follow a healthy meal plan. This can help you reduce the effects of PCOS. ? Eat a healthy diet that includes lean proteins, complex carbohydrates, fresh fruits and vegetables, low-fat dairy products, and healthy fats. Make sure to eat enough fiber.  If you are overweight, lose weight as told by your health care provider. ? Losing 10% of your body weight may improve symptoms. ? Your health care provider can determine how much weight loss is best for you and can help you lose weight safely.  Keep all follow-up visits as told by your health care provider. This is important. Contact a health care provider if:  Your symptoms do not get better with medicine.  You develop new symptoms. This information is not intended to replace advice given to you by your health care provider. Make sure you discuss any questions you have with your health care provider. Document Revised: 02/07/2017 Document Reviewed: 08/13/2015 Elsevier Patient Education  2020 [employer].

## 2019-06-04 NOTE — Progress Notes (Signed)
29 y.o. G0P0000 Single Black or African American Not Hispanic or Latino female here for annual exam and to get established.   Menarche age 36, always irregular.  In the last year she has been cycling every 1-3 months. Last on OCP's ~1 year ago. Once prior to the last year she bleed for a month straight. In the last year bleeding for 4 days. On her heaviest day she changes her pad in 6 hour. H/O boils mainly in the axilla and her breast.  Some fatigue, no other thyroid c/o. No galactorrhea. Has hair growth on her chin and lower abdomen. Sexually active with a friend, he could have other partners. Uses condoms. No dyspareunia. Period Cycle (Days): 90 Period Duration (Days): 4 Period Pattern: (!) Irregular Menstrual Flow: Moderate, Heavy Menstrual Control: Maxi pad Menstrual Control Change Freq (Hours): 6 Dysmenorrhea: (!) Mild Dysmenorrhea Symptoms: Headache, Nausea, Cramping  Previously her cramps have been worse.   Negative STD testing in 9/20, declines repeat testing.   Patient's last menstrual period was 05/19/2019 (exact date).          Sexually active: Yes.    The current method of family planning is condoms.    Exercising: No.  The patient does not participate in regular exercise at present. Smoker:  no  Health Maintenance: Pap:  2015 History of abnormal Pap:  no TDaP:  Unsure  Gardasil: unsure, information given   reports that she has never smoked. She has never used smokeless tobacco. She reports current alcohol use. She reports that she does not use drugs. Just occasional ETOH. 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 Doing both full time. She owns her home, lives alone.  Past Medical History:  Diagnosis Date  . Amenorrhea   . Dysmenorrhea     No past surgical history on file.  Current Outpatient Medications  Medication Sig Dispense Refill  . mupirocin ointment (BACTROBAN) 2 % Apply thin film twice daily 22 g 0   No  current facility-administered medications for this visit.    Family History  Problem Relation Age of Onset  . Migraines Mother   . Breast cancer Maternal Grandmother 72  . Cancer Maternal Grandmother   . Hypertension Maternal Grandmother   . Heart failure Paternal Grandmother     Review of Systems  All other systems reviewed and are negative. She c/o leaking urine with full bladder and movement (like getting out of the car). No caffeine. Leaks about every other day, small amounts, for many years. Just a drop or 2. H/O enuresis, still occurs intermittently. She gets up 2 x a night to avoid leaking.  Exam:   BP 118/64   Pulse 88   Temp (!) 97.1 F (36.2 C)   Ht 5\' 8"  (1.727 m)   Wt 247 lb (112 kg)   LMP 05/19/2019 (Exact Date)   SpO2 97%   BMI 37.56 kg/m   Weight change: @WEIGHTCHANGE @ Height:   Height: 5\' 8"  (172.7 cm)  Ht Readings from Last 3 Encounters:  06/04/19 5\' 8"  (1.727 m)  11/13/18 5\' 8"  (1.727 m)  04/07/18 5\' 8"  (1.727 m)    General appearance: alert, cooperative and appears stated age Head: Normocephalic, without obvious abnormality, atraumatic Neck: no adenopathy, supple, symmetrical, trachea midline and thyroid normal to inspection and palpation Lungs: clear to auscultation bilaterally Cardiovascular: regular rate and rhythm Breasts: normal appearance, no masses or tenderness Abdomen: soft, non-tender; non distended,  no masses,  no organomegaly  Extremities: extremities normal, atraumatic, no cyanosis or edema Skin: Skin color, texture, turgor normal. No rashes or lesions. Few scars from prior boils noted on her breast. Mild hirsutism on her chin and lower abdomen.  Lymph nodes: Cervical, supraclavicular, and axillary nodes normal. No abnormal inguinal nodes palpated Neurologic: Grossly normal   Pelvic: External genitalia:  no lesions              Urethra:  normal appearing urethra with no masses, tenderness or lesions              Bartholins and Skenes:  normal                 Vagina: normal appearing vagina with normal color and discharge, no lesions              Cervix: no lesions               Bimanual Exam:  Uterus:  no masses or tenderness, exam limited by BMI              Adnexa: no mass, fullness, tenderness               Rectovaginal: Confirms               Anus:  normal sphincter tone, no lesions  Carolynn Serve chaperoned for the exam.  A:  Well Woman with normal exam  Contraception   Oligomenorrhea, discussed long term risks and need for endometrial protection  Reports h/o hidradenitis suppurativa   Mild hirsutism (given mild presentation will just check testosterone level)  Picture C/W PCOS  Urinary incontinence, occasional enuresis   P:   Pap with reflex hpv  Declines std testing  Screening labs, TSH, HgbA1C, prolactin, testosterone  UPT negative  Start Yaz, risks reviewed (including possible increased risk of clots from the progesterone in the Yaz), no contraindications. Information given  F/U in 3 months  Use condoms for std protection  Discussed PCOS and risk of metabolic syndrome and how weight loss can help

## 2019-06-05 LAB — COMPREHENSIVE METABOLIC PANEL
ALT: 14 IU/L (ref 0–32)
AST: 13 IU/L (ref 0–40)
Albumin/Globulin Ratio: 1.8 (ref 1.2–2.2)
Albumin: 4.7 g/dL (ref 3.9–5.0)
Alkaline Phosphatase: 55 IU/L (ref 39–117)
BUN/Creatinine Ratio: 8 — ABNORMAL LOW (ref 9–23)
BUN: 6 mg/dL (ref 6–20)
Bilirubin Total: 0.3 mg/dL (ref 0.0–1.2)
CO2: 28 mmol/L (ref 20–29)
Calcium: 9.7 mg/dL (ref 8.7–10.2)
Chloride: 100 mmol/L (ref 96–106)
Creatinine, Ser: 0.78 mg/dL (ref 0.57–1.00)
GFR calc Af Amer: 121 mL/min/{1.73_m2} (ref >59)
GFR calc non Af Amer: 105 mL/min/{1.73_m2} (ref >59)
Globulin, Total: 2.6 g/dL (ref 1.5–4.5)
Glucose: 66 mg/dL (ref 65–99)
Potassium: 4 mmol/L (ref 3.5–5.2)
Sodium: 141 mmol/L (ref 134–144)
Total Protein: 7.3 g/dL (ref 6.0–8.5)

## 2019-06-05 LAB — CBC
Hematocrit: 38.5 % (ref 34.0–46.6)
Hemoglobin: 12.3 g/dL (ref 11.1–15.9)
MCH: 28.4 pg (ref 26.6–33.0)
MCHC: 31.9 g/dL (ref 31.5–35.7)
MCV: 89 fL (ref 79–97)
Platelets: 307 10*3/uL (ref 150–450)
RBC: 4.33 x10E6/uL (ref 3.77–5.28)
RDW: 11.9 % (ref 11.7–15.4)
WBC: 9.2 10*3/uL (ref 3.4–10.8)

## 2019-06-05 LAB — HEMOGLOBIN A1C
Est. average glucose Bld gHb Est-mCnc: 105 mg/dL
Hgb A1c MFr Bld: 5.3 % (ref 4.8–5.6)

## 2019-06-05 LAB — LIPID PANEL
Chol/HDL Ratio: 3.9 ratio (ref 0.0–4.4)
Cholesterol, Total: 175 mg/dL (ref 100–199)
HDL: 45 mg/dL (ref >39)
LDL Chol Calc (NIH): 109 mg/dL — ABNORMAL HIGH (ref 0–99)
Triglycerides: 115 mg/dL (ref 0–149)
VLDL Cholesterol Cal: 21 mg/dL (ref 5–40)

## 2019-06-05 LAB — TSH: TSH: 1.78 u[IU]/mL (ref 0.450–4.500)

## 2019-06-05 LAB — TESTOSTERONE: Testosterone: 22 ng/dL (ref 8–48)

## 2019-06-05 LAB — PROLACTIN: Prolactin: 9.1 ng/mL (ref 4.8–23.3)

## 2019-06-07 LAB — CYTOLOGY - PAP
Adequacy: ABSENT
Diagnosis: NEGATIVE

## 2019-06-09 ENCOUNTER — Telehealth: Payer: Self-pay

## 2019-06-09 NOTE — Telephone Encounter (Signed)
Spoke with patient. Advised of message as seen below from Dr.Jertson. Patient verbalizes understanding. Patient is asking about PCOS. Labs were normal on 06/04/2019. Patient is asking what can be done for further evaluation of PCOS?

## 2019-06-09 NOTE — Telephone Encounter (Signed)
-----   Message from Romualdo Bolk, MD sent at 06/09/2019  7:46 AM EDT ----- Please let the patient know that her pap is negative for precancerous changes and satisfactory for evaluation. The transformation zone component was absent, this is the area where cells in the cervix change from one type of cell to another type of cell. When this area isn't identified in her age group the recommendation is to repeat the pap in one year. Nothing to worry about, just repeat the pap at her annual exam next year.

## 2019-06-10 NOTE — Telephone Encounter (Signed)
Her testosterone level was normal. She doesn't need any further work up at this time for PCOS. The Yaz will control her cycles, she should work on weight loss and have yearly screening lab work.

## 2019-06-10 NOTE — Telephone Encounter (Signed)
Call to patient, left detailed message on mobile number, ok per dpr. Advised as seen below per Dr. Oscar La. Return call to office if any additional questions.   Encounter closed.

## 2019-08-27 ENCOUNTER — Other Ambulatory Visit: Payer: Self-pay

## 2019-08-27 ENCOUNTER — Ambulatory Visit (INDEPENDENT_AMBULATORY_CARE_PROVIDER_SITE_OTHER): Payer: PRIVATE HEALTH INSURANCE | Admitting: Medical

## 2019-08-27 ENCOUNTER — Other Ambulatory Visit: Payer: Self-pay | Admitting: Obstetrics and Gynecology

## 2019-08-27 ENCOUNTER — Other Ambulatory Visit (HOSPITAL_COMMUNITY)
Admission: RE | Admit: 2019-08-27 | Discharge: 2019-08-27 | Disposition: A | Discharge status: Home / Self Care | Payer: PRIVATE HEALTH INSURANCE | Source: Ambulatory Visit | Attending: Medical | Admitting: Medical

## 2019-08-27 VITALS — BP 106/67 | HR 67 | Resp 18 | Ht 68.0 in | Wt 249.6 lb

## 2019-08-27 DIAGNOSIS — Z113 Encounter for screening for infections with a predominantly sexual mode of transmission: Secondary | ICD-10-CM | POA: Insufficient documentation

## 2019-08-27 DIAGNOSIS — R300 Dysuria: Secondary | ICD-10-CM

## 2019-08-27 DIAGNOSIS — R350 Frequency of micturition: Secondary | ICD-10-CM | POA: Diagnosis not present

## 2019-08-27 DIAGNOSIS — N898 Other specified noninflammatory disorders of vagina: Secondary | ICD-10-CM | POA: Diagnosis not present

## 2019-08-27 LAB — POC URINALSYSI DIPSTICK (AUTOMATED)
Bilirubin, UA: 2
Blood, UA: 10
Glucose, UA: NEGATIVE
Ketones, UA: 5
Nitrite, UA: POSITIVE
Protein, UA: POSITIVE — AB
Spec Grav, UA: 1.015 (ref 1.010–1.025)
Urobilinogen, UA: 2 E.U./dL — AB
pH, UA: 7.5 (ref 5.0–8.0)

## 2019-08-27 MED ORDER — NITROFURANTOIN MONOHYD MACRO 100 MG PO CAPS: 100.0000 mg | ORAL_CAPSULE | Freq: Two times a day (BID) | ORAL | 0 refills | Status: DC

## 2019-08-27 NOTE — Progress Notes (Signed)
   Subjective:    Patient ID: Kathleen Harding, female    DOB: [birth year], 29 y.o.   MRN: [id number]  HPI  Pt in for frequent urination with some pain as well. Symptoms for 2 days. Pt took azostandard and has less pain now. No fever, no chills or sweats. No pain over kidney or over bladder. Never had uti before.  Pt states last week had mild vaginal itching. But no dc.   No recent unprotected sex. Does indicate she would like screening std test as precaution.   lmp- Jul 30, 2019.        Review of Systems  Constitutional: Negative for chills, fatigue and fever.  Respiratory: Negative for cough, chest tightness, shortness of breath and wheezing.   Cardiovascular: Negative for chest pain and palpitations.  Gastrointestinal: Negative for abdominal pain.  Genitourinary: Positive for dysuria and frequency. Negative for dyspareunia, flank pain and urgency.  Musculoskeletal: Negative for back pain.  Neurological: Negative for dizziness, speech difficulty, light-headedness and headaches.  Hematological: Negative for adenopathy. Does not bruise/bleed easily.  Psychiatric/Behavioral: Negative for behavioral problems and decreased concentration.       Objective:   Physical Exam  General Appearance- Not in acute distress.  HEENT Eyes- Scleraeral/Conjuntiva-bilat- Not Yellow. Mouth & Throat- Normal.  Chest and Lung Exam Auscultation: Breath sounds:-Normal. Adventitious sounds:- No Adventitious sounds.  Cardiovascular Auscultation:Rythm - Regular. Heart Sounds -Normal heart sounds.  Abdomen Inspection:-Inspection Normal.  Palpation/Perucssion: Palpation and Percussion of the abdomen reveal- Non Tender, No Rebound tenderness, No rigidity(Guarding) and No Palpable abdominal masses.  Liver:-Normal.  Spleen:- Normal.   Back- no cva tenderness.       Assessment & Plan:  You appear to have a urinary tract infection. I am prescribing macrobid antibiotic for the probable  infection. Hydrate well. I am sending out a urine culture. During the interim if your signs and symptoms worsen rather than improving please notify us. We will notify your when the culture results are back.  STD lab test durine along with screening urine ancillary studies.  Follow up in 7 days or as needed.  [name], PA-C   Time spent with patient today was 20  minutes which consisted of chart review, discussing diagnosis, work up, treatment and documentation.

## 2019-08-27 NOTE — Telephone Encounter (Signed)
Medication refill request: Yaz Last AEX:  06/04/19 Next AEX: none scheduled Last MMG (if hormonal medication request):n/a Refill authorized: Please advise  Tried calling patient to schedule 3 month OCP f/u. No answer, left message for patient to call our office back to schedule.

## 2019-08-27 NOTE — Patient Instructions (Signed)
You appear to have a urinary tract infection. I am prescribing macrobid antibiotic for the probable infection. Hydrate well. I am sending out a urine culture. During the interim if your signs and symptoms worsen rather than improving please notify us. We will notify your when the culture results are back.  STD lab test durine along with screening urine ancillary studies.  Follow up in 7 days or as needed.

## 2019-08-28 ENCOUNTER — Other Ambulatory Visit: Payer: Self-pay | Admitting: Obstetrics and Gynecology

## 2019-08-28 MED ORDER — DROSPIRENONE-ETHINYL ESTRADIOL 3-0.02 MG PO TABS: 1.0000 | ORAL_TABLET | Freq: Every day | ORAL | 0 refills | Status: DC

## 2019-08-30 ENCOUNTER — Telehealth: Payer: Self-pay | Admitting: Medical

## 2019-08-30 ENCOUNTER — Telehealth: Payer: Self-pay

## 2019-08-30 DIAGNOSIS — R350 Frequency of micturition: Secondary | ICD-10-CM

## 2019-08-30 DIAGNOSIS — N898 Other specified noninflammatory disorders of vagina: Secondary | ICD-10-CM

## 2019-08-30 LAB — URINE CYTOLOGY ANCILLARY ONLY
Bacterial Vaginitis-Urine: NEGATIVE
Candida Urine: NEGATIVE
Chlamydia: NEGATIVE
Comment: NEGATIVE
Comment: NEGATIVE
Comment: NORMAL
Neisseria Gonorrhea: NEGATIVE
Trichomonas: NEGATIVE

## 2019-08-30 LAB — URINE CULTURE
MICRO NUMBER:: 10608002
SPECIMEN QUALITY:: ADEQUATE

## 2019-08-30 LAB — SYPHILIS: RPR W/REFLEX TO RPR TITER AND TREPONEMAL ANTIBODIES, TRADITIONAL SCREENING AND DIAGNOSIS ALGORITHM: RPR Ser Ql: NONREACTIVE

## 2019-08-30 LAB — HIV ANTIBODY (ROUTINE TESTING W REFLEX): HIV 1&2 Ab, 4th Generation: NONREACTIVE

## 2019-08-30 MED ORDER — FLUCONAZOLE 150 MG PO TABS
150.0000 mg | ORAL_TABLET | Freq: Once | ORAL | 0 refills | Status: AC
Start: 2019-08-30 — End: 2019-08-30

## 2019-08-30 NOTE — Telephone Encounter (Signed)
Patient called concerned about needing another culture , stated she is doing somewhat better but she is having some itching now , she has a lab appt tomorrow.. please drop correct orders

## 2019-08-30 NOTE — Telephone Encounter (Signed)
I put in urine culture and ancillary orders. Diflucan sent to pt pharmacy in event getting yeast infection from antibiotic. Will check yeast infection on ancillary study

## 2019-08-30 NOTE — Addendum Note (Signed)
Addended by: Gwenevere Abbot on: 08/30/2019 03:45 PM   Modules accepted: Orders

## 2019-08-30 NOTE — Telephone Encounter (Signed)
Urine culture and ancillary order placed.

## 2019-08-31 ENCOUNTER — Other Ambulatory Visit: Payer: PRIVATE HEALTH INSURANCE

## 2019-09-20 ENCOUNTER — Other Ambulatory Visit: Payer: Self-pay | Admitting: Obstetrics and Gynecology

## 2019-09-21 MED ORDER — DROSPIRENONE-ETHINYL ESTRADIOL 3-0.02 MG PO TABS: 1.0000 | ORAL_TABLET | Freq: Every day | ORAL | 0 refills | Status: DC

## 2019-09-21 NOTE — Telephone Encounter (Signed)
Medication refill request: Yaz Last AEX:  06-04-19 JJ Next AEX: detailed message left  Last MMG (if hormonal medication request): n/a Refill authorized: Today, please advise.   Detailed message left for patient to call and schedule 3 month OCP follow up.   Medication pended for #28, 0RF. Please refill if appropriate.

## 2019-09-28 ENCOUNTER — Other Ambulatory Visit: Payer: Self-pay

## 2019-09-28 NOTE — Telephone Encounter (Signed)
Patient is calling in regards to birth control medication. Patient would like to know what is needed for her to have a 3 month supply filled.

## 2019-09-29 MED ORDER — DROSPIRENONE-ETHINYL ESTRADIOL 3-0.02 MG PO TABS: 1.0000 | ORAL_TABLET | Freq: Every day | ORAL | 0 refills | Status: DC

## 2019-09-29 NOTE — Telephone Encounter (Signed)
Medication refill request: OCP Last AEX:  06-04-19 JJ  Next follow up: 10-28-19 Last MMG (if hormonal medication request): n/a Refill authorized: Today, please advise.   Spoke with patient. Advised patient will need to schedule 3 month medication follow up. Patient agreeable. Mychart visit scheduled for 10-28-19 at 1600. Patient agreeable to date and time of appointment. Patient states she will run out of OCP prior to appointment.   Medication pended for #28, 0RF. Please refill if appropriate. Pharmacy confirmed as [facility] [address]

## 2019-10-11 ENCOUNTER — Telehealth: Payer: Self-pay

## 2019-10-11 NOTE — Telephone Encounter (Signed)
Spoke with pt. Pt states picking up birth control Rx from [facility] and was given brand "Montez Morita" ,but still drospiernone-ethinyl estradiol 3/0.02 mg. Pt states prefers "Lowella Bandy" [facility]  [facility] to see if they have and can fill Rx and then have Rx transferred from [facility]. Pt agreeable and will return call to office with any additional questions or concerns with Rx.  Encounter closed.

## 2019-10-11 NOTE — Telephone Encounter (Signed)
Patient is calling in regards to birth control medication. Patient stated she received a different brand of the prescription and would like to discuss with nurse.

## 2019-10-15 ENCOUNTER — Telehealth: Payer: Self-pay | Admitting: Obstetrics and Gynecology

## 2019-10-15 NOTE — Telephone Encounter (Signed)
Patient would like to speak with nurse regarding prescription.

## 2019-10-16 ENCOUNTER — Other Ambulatory Visit: Payer: Self-pay | Admitting: Obstetrics and Gynecology

## 2019-10-18 NOTE — Telephone Encounter (Signed)
Rx [place] sent to pharmacy on file. # 1 pack, 0RF.  Encounter closed.

## 2019-10-18 NOTE — Telephone Encounter (Signed)
Spoke back with pt. Pt stating [facility] pharmacy would not take allow her to return her wrong Rx. See previous encounter dated 10/11/19. [facility]. Pt has appt on 8/19 with Dr Oscar La for update on meds. Pt agreeable.  [facility]. Pt aware and thankful. Pt advised to call insurance to get coverage for 1 pack since other Rx wasn't allowed to be returned. Pt agreeable. Pt to return call if any questions or concerns.  Encounter closed.

## 2019-10-28 ENCOUNTER — Telehealth (INDEPENDENT_AMBULATORY_CARE_PROVIDER_SITE_OTHER): Payer: 59 | Admitting: Obstetrics and Gynecology

## 2019-10-28 ENCOUNTER — Encounter: Payer: Self-pay | Admitting: Obstetrics and Gynecology

## 2019-10-28 ENCOUNTER — Other Ambulatory Visit: Payer: Self-pay

## 2019-10-28 DIAGNOSIS — Z872 Personal history of diseases of the skin and subcutaneous tissue: Secondary | ICD-10-CM | POA: Diagnosis not present

## 2019-10-28 DIAGNOSIS — Z8742 Personal history of other diseases of the female genital tract: Secondary | ICD-10-CM | POA: Diagnosis not present

## 2019-10-28 DIAGNOSIS — Z3041 Encounter for surveillance of contraceptive pills: Secondary | ICD-10-CM

## 2019-10-28 MED ORDER — DROSPIRENONE-ETHINYL ESTRADIOL 3-0.02 MG PO TABS: 1.0000 | ORAL_TABLET | Freq: Every day | ORAL | 2 refills | Status: DC

## 2019-10-28 NOTE — Progress Notes (Signed)
Virtual Visit via Video Note  I connected with Kathleen Harding on 10/28/19 at  4:00 PM EDT by a video enabled telemedicine application and verified that I am speaking with the correct person using two identifiers.  Location: Patient: Home Provider: Office at [facility]   The patient is aware of the limitations of evaluation and management by telemedicine and the availability of in person appointments.  GYNECOLOGY  VISIT   HPI: 29 y.o.   Single Black or African American Not Hispanic or Latino  female   G0P0000 with No LMP recorded.   here for a virtual visit to f/u on OCP's. At the time of her annual exam in 3/21 she reported cycles every 1-3 months.  H/O boils in her axilla and breast and c/o hirsutism. Picture c/w PCOS. She was started on Yaz. Cycles q month x 3-4 days. Saturating a pad in 7 hours. Minal cramps. Her boils are better.  GYNECOLOGIC HISTORY: No LMP recorded. Contraception:Nikki OCP's Menopausal hormone therapy: none        OB History    Gravida  0   Para  0   Term  0   Preterm  0   AB  0   Living  0     SAB  0   TAB  0   Ectopic  0   Multiple  0   Live Births  0              Patient Active Problem List   Diagnosis Date Noted  . Irregular periods 06/04/2019  . History of hidradenitis suppurativa 06/04/2019  . Primary oligomenorrhea 06/04/2019  . BMI 37.0-37.9, adult 06/04/2019  . PCOS (polycystic ovarian syndrome) 06/04/2019    Past Medical History:  Diagnosis Date  . Amenorrhea   . Dysmenorrhea     No past surgical history on file.  Current Outpatient Medications  Medication Sig Dispense Refill  . mupirocin ointment (BACTROBAN) 2 % Apply thin film twice daily 22 g 0  . NIKKI 3-0.02 MG tablet TAKE 1 TABLET BY MOUTH EVERY DAY 28 tablet 0  . nitrofurantoin, macrocrystal-monohydrate, (MACROBID) 100 MG capsule Take 1 capsule (100 mg total) by mouth 2 (two) times daily. 14 capsule 0   No current  facility-administered medications for this visit.     ALLERGIES: Patient has no known allergies.  Family History  Problem Relation Age of Onset  . Migraines Mother   . Breast cancer Maternal Grandmother 69  . Cancer Maternal Grandmother   . Hypertension Maternal Grandmother   . Heart failure Paternal Grandmother     Social History   Socioeconomic History  . Marital status: Single    Spouse name: Not on file  . Number of children: Not on file  . Years of education: Not on file  . Highest education level: Not on file  Occupational History  . Not on file  Tobacco Use  . Smoking status: Never Smoker  . Smokeless tobacco: Never Used  Vaping Use  . Vaping Use: Never used  Substance and Sexual Activity  . Alcohol use: Yes    Comment: 2 drinks every 2 weeks.  . Drug use: Never  . Sexual activity: Yes    Birth control/protection: None  Other Topics Concern  . Not on file  Social History Narrative  . Not on file   Social Determinants of Health   Financial Resource Strain:   . Difficulty of Paying Living Expenses: Not on file  Food Insecurity:   .  Worried About Programme researcher, broadcasting/film/video in the Last Year: Not on file  . Ran Out of Food in the Last Year: Not on file  Transportation Needs:   . Lack of Transportation (Medical): Not on file  . Lack of Transportation (Non-Medical): Not on file  Physical Activity:   . Days of Exercise per Week: Not on file  . Minutes of Exercise per Session: Not on file  Stress:   . Feeling of Stress : Not on file  Social Connections:   . Frequency of Communication with Friends and Family: Not on file  . Frequency of Social Gatherings with Friends and Family: Not on file  . Attends Religious Services: Not on file  . Active Member of Clubs or Organizations: Not on file  . Attends Banker Meetings: Not on file  . Marital Status: Not on file  Intimate Partner Violence:   . Fear of Current or Ex-Partner: Not on file  . Emotionally  Abused: Not on file  . Physically Abused: Not on file  . Sexually Abused: Not on file    ROS no c/o  PHYSICAL EXAMINATION:    There were no vitals taken for this visit.    General appearance: alert, cooperative and appears stated age  ASSESSMENT H/O oligomenorrhea, clinical picture c/w PCO's On generic of Yaz for contraception, cycle control and to help with recurrent boils.     PLAN Doing well on generic of Yaz, normal cycles, boils have improved Continue OCP's F/U in 3/22 for an annual exam Call with any concerns     The patient was advised to call back or seek an in-person evaluation if the symptoms worsen or if the condition fails to improve as anticipated.

## 2019-11-04 ENCOUNTER — Other Ambulatory Visit: Payer: Self-pay | Admitting: Obstetrics and Gynecology

## 2019-11-04 DIAGNOSIS — Z3041 Encounter for surveillance of contraceptive pills: Secondary | ICD-10-CM

## 2019-11-04 NOTE — Telephone Encounter (Signed)
Pharmacy change requested by patient.   Medication refill request: Drospirenone 3/0/02mg   Last AEX:  06/04/19 Next AEX: not yet scheduled Last MMG (if hormonal medication request): NA  Refill authorized: 84/2

## 2019-11-05 NOTE — Telephone Encounter (Signed)
[facility] If this request is from the pharmacy, please decline. Otherwise check with the patient.

## 2019-11-05 NOTE — Telephone Encounter (Signed)
[facility] Spoke with pharmacy and verified Rx was there and pt has not picked up. Will place call to pt. Left detailed message for pt about Rx per DPR. Pt to return call with any questions or concerns.  Encounter closed

## 2019-11-23 ENCOUNTER — Telehealth: Payer: Self-pay | Admitting: Medical

## 2019-11-23 ENCOUNTER — Other Ambulatory Visit: Payer: Self-pay

## 2019-11-23 ENCOUNTER — Ambulatory Visit (HOSPITAL_BASED_OUTPATIENT_CLINIC_OR_DEPARTMENT_OTHER)
Admission: RE | Admit: 2019-11-23 | Discharge: 2019-11-23 | Disposition: A | Discharge status: Home / Self Care | Payer: PRIVATE HEALTH INSURANCE | Source: Ambulatory Visit | Attending: Medical | Admitting: Medical

## 2019-11-23 ENCOUNTER — Ambulatory Visit (INDEPENDENT_AMBULATORY_CARE_PROVIDER_SITE_OTHER): Payer: PRIVATE HEALTH INSURANCE | Admitting: Medical

## 2019-11-23 VITALS — BP 113/70 | HR 76 | Resp 18 | Ht 68.0 in | Wt 250.4 lb

## 2019-11-23 DIAGNOSIS — M79609 Pain in unspecified limb: Secondary | ICD-10-CM

## 2019-11-23 DIAGNOSIS — M79662 Pain in left lower leg: Secondary | ICD-10-CM | POA: Insufficient documentation

## 2019-11-23 MED ORDER — CEPHALEXIN 500 MG PO CAPS
500.0000 mg | ORAL_CAPSULE | Freq: Two times a day (BID) | ORAL | 0 refills | Status: DC
Start: 2019-11-23 — End: 2021-05-28

## 2019-11-23 NOTE — Progress Notes (Signed)
° °  Subjective:    Patient ID: Kathleen Harding, female    DOB: 1991/02/16, 29 y.o.   MRN: [id number]  HPI  Pt in reporting some recent pain in left leg. At first behind her knee and now pain also in calf. No fall or trauma. Pt is on ocp. No recent long trips by car or flights. States max distance in car 2 hours.  No sob, no wheezing.  Pt not taking anything for pain.  Pt state pain more in calf know. Last week more in popliteal.   Review of Systems  Constitutional: Negative for chills, fatigue and fever.  HENT: Negative for dental problem and ear discharge.   Respiratory: Negative for cough, chest tightness, shortness of breath and wheezing.   Cardiovascular: Negative for chest pain and palpitations.  Musculoskeletal: Negative for back pain.       Left lower extremity pain.  Pain  in popliteal area 1 week ago and now more in mid calf.  Something pain still in popliteal area.       Objective:   Physical Exam  General- No acute distress. Pleasant patient. Neck- Full range of motion, no jvd Lungs- Clear, even and unlabored. Heart- regular rate and rhythm. Neurologic- CNII- XII grossly intact. Left lower extremity-mild tenderness to palpation in the popliteal area.  Calf symmetric compared to the right side.  Mild tenderness to palpation in the mid aspect of the left calf.  No redness or warmth presently.     Assessment & Plan:  [phone] For your left popliteal area pain and left mid calf pain on palpation will get [place] of lower ext. Please go down now to get scheduled. Asking that they do [place] today.   If study negative then recommend ace compression mid calf and low dose ibuprofen. Also mild calf stretches.  If dvt found start tx for.  Follow up date to be determined after study review.

## 2019-11-23 NOTE — Telephone Encounter (Signed)
rx keflex in event infection type changes occur to calf.

## 2019-11-23 NOTE — Patient Instructions (Addendum)
For your left popliteal area pain and left mid calf pain on palpation will get [place] of lower ext. Please go down now to get scheduled. Asking that they do [place] today.   If study negative then recommend ace compression mid calf and low dose ibuprofen. Also mild calf stretches.  If dvt found start tx for.  Follow up date to be determined after study review.

## 2019-11-24 ENCOUNTER — Telehealth: Payer: Self-pay

## 2019-11-24 NOTE — Telephone Encounter (Signed)
Nurse Assessment Nurse: Kathleen Austin, RN, Kathleen Harding Date/Time Kathleen Harding Time): 11/23/2019 6:00:41 PM Confirm and document reason for call. If symptomatic, describe symptoms. ---Caller states she got an update on her MyChart about U/S on legs. States impression says superficial thrombus but no DVT. Asking for advise on what results mean. Has the patient had close contact with a person known or suspected to have the novel coronavirus illness OR traveled / lives in area with major community spread (including international travel) in the last 14 days from the onset of symptoms? * If Asymptomatic, screen for exposure and travel within the last 14 days. ---No Does the patient have any new or worsening symptoms? ---No Please document clinical information provided and list any resource used. ---Advised caller if results were critical they would have called her immediately. Please follow up with patient in the AM to discuss results. Disp. Time Kathleen Harding Time) Disposition Final User 11/23/2019 6:04:41 PM Clinical Call Yes Goins, RN, Kathleen Harding

## 2020-05-12 ENCOUNTER — Ambulatory Visit: Payer: 59 | Admitting: Family Medicine

## 2020-09-04 ENCOUNTER — Other Ambulatory Visit: Payer: Self-pay | Admitting: Obstetrics and Gynecology

## 2021-03-12 DIAGNOSIS — Z20822 Contact with and (suspected) exposure to covid-19: Secondary | ICD-10-CM | POA: Diagnosis not present

## 2021-05-28 ENCOUNTER — Encounter: Payer: Self-pay | Admitting: Medical

## 2021-05-28 ENCOUNTER — Ambulatory Visit: Payer: BC Managed Care – PPO | Admitting: Medical

## 2021-05-28 VITALS — BP 120/70 | HR 72 | Resp 18 | Ht 68.0 in | Wt 281.0 lb

## 2021-05-28 DIAGNOSIS — R5383 Other fatigue: Secondary | ICD-10-CM | POA: Diagnosis not present

## 2021-05-28 DIAGNOSIS — N926 Irregular menstruation, unspecified: Secondary | ICD-10-CM

## 2021-05-28 DIAGNOSIS — Z833 Family history of diabetes mellitus: Secondary | ICD-10-CM

## 2021-05-28 DIAGNOSIS — Z0000 Encounter for general adult medical examination without abnormal findings: Secondary | ICD-10-CM | POA: Diagnosis not present

## 2021-05-28 DIAGNOSIS — Z113 Encounter for screening for infections with a predominantly sexual mode of transmission: Secondary | ICD-10-CM

## 2021-05-28 LAB — POCT URINE PREGNANCY: Preg Test, Ur: NEGATIVE

## 2021-05-28 NOTE — Patient Instructions (Addendum)
For you wellness exam today I have ordered cbc, cmp and lipid panel. ? ?Vaccine declined ? ?For fatigue will get tsh, t4, iron level and b12. ? ?Recommend exercise and healthy diet. ? ?We will let you know lab results as they come in. ? ?For late menses, fertility concernes,  and prior irregular menses  will get pregnancy test and  fsh level. ? ?FH diabetes will check a1c. After lab may rx metformin. ? ?After review of labs rever to gyn to evaluate for pcos/infertility. ? ? ? ?Follow up date appointment will be determined after lab review.   ? ? ?Preventive Care 11-73 Years Old, Female ?Preventive care refers to lifestyle choices and visits with your health care provider that can promote health and wellness. Preventive care visits are also called wellness exams. ?What can I expect for my preventive care visit? ?Counseling ?During your preventive care visit, your health care provider may ask about your: ?Medical history, including: ?Past medical problems. ?Family medical history. ?Pregnancy history. ?Current health, including: ?Menstrual cycle. ?Method of birth control. ?Emotional well-being. ?Home life and relationship well-being. ?Sexual activity and sexual health. ?Lifestyle, including: ?Alcohol, nicotine or tobacco, and drug use. ?Access to firearms. ?Diet, exercise, and sleep habits. ?Work and work Statistician. ?Sunscreen use. ?Safety issues such as seatbelt and bike helmet use. ?Physical exam ?Your health care provider may check your: ?Height and weight. These may be used to calculate your BMI (body mass index). BMI is a measurement that tells if you are at a healthy weight. ?Waist circumference. This measures the distance around your waistline. This measurement also tells if you are at a healthy weight and may help predict your risk of certain diseases, such as type 2 diabetes and high blood pressure. ?Heart rate and blood pressure. ?Body temperature. ?Skin for abnormal spots. ?What immunizations do I  need? ?Vaccines are usually given at various ages, according to a schedule. Your health care provider will recommend vaccines for you based on your age, medical history, and lifestyle or other factors, such as travel or where you work. ?What tests do I need? ?Screening ?Your health care provider may recommend screening tests for certain conditions. This may include: ?Pelvic exam and Pap test. ?Lipid and cholesterol levels. ?Diabetes screening. This is done by checking your blood sugar (glucose) after you have not eaten for a while (fasting). ?Hepatitis B test. ?Hepatitis C test. ?HIV (human immunodeficiency virus) test. ?STI (sexually transmitted infection) testing, if you are at risk. ?BRCA-related cancer screening. This may be done if you have a family history of breast, ovarian, tubal, or peritoneal cancers. ?Talk with your health care provider about your test results, treatment options, and if necessary, the need for more tests. ?Follow these instructions at home: ?Eating and drinking ? ?Eat a healthy diet that includes fresh fruits and vegetables, whole grains, lean protein, and low-fat dairy products. ?Take vitamin and mineral supplements as recommended by your health care provider. ?Do not drink alcohol if: ?Your health care provider tells you not to drink. ?You are pregnant, may be pregnant, or are planning to become pregnant. ?If you drink alcohol: ?Limit how much you have to 0-1 drink a day. ?Know how much alcohol is in your drink. In the [place] one drink equals one 12 oz bottle of beer (355 mL), one 5 oz glass of wine (148 mL), or one 1? oz glass of hard liquor (44 mL). ?Lifestyle ?Brush your teeth every morning and night with fluoride toothpaste. Floss one time each day. ?Exercise  for at least 30 minutes 5 or more days each week. ?Do not use any products that contain nicotine or tobacco. These products include cigarettes, chewing tobacco, and vaping devices, such as e-cigarettes. If you need help  quitting, ask your health care provider. ?Do not use drugs. ?If you are sexually active, practice safe sex. Use a condom or other form of protection to prevent STIs. ?If you do not wish to become pregnant, use a form of birth control. If you plan to become pregnant, see your health care provider for a prepregnancy visit. ?Find healthy ways to manage stress, such as: ?Meditation, yoga, or listening to music. ?Journaling. ?Talking to a trusted person. ?Spending time with friends and family. ?Minimize exposure to UV radiation to reduce your risk of skin cancer. ?Safety ?Always wear your seat belt while driving or riding in a vehicle. ?Do not drive: ?If you have been drinking alcohol. Do not ride with someone who has been drinking. ?If you have been using any mind-altering substances or drugs. ?While texting. ?When you are tired or distracted. ?Wear a helmet and other protective equipment during sports activities. ?If you have firearms in your house, make sure you follow all gun safety procedures. ?Seek help if you have been physically or sexually abused. ?What's next? ?Go to your health care provider once a year for an annual wellness visit. ?Ask your health care provider how often you should have your eyes and teeth checked. ?Stay up to date on all vaccines. ?This information is not intended to replace advice given to you by your health care provider. Make sure you discuss any questions you have with your health care provider. ?Document Revised: 08/23/2020 Document Reviewed: 08/23/2020 ?Elsevier Patient Education ? 2022 [place]. ? ? ? ? ?

## 2021-05-28 NOTE — Progress Notes (Signed)
? ?Subjective:  ? ? Patient ID: Kathleen Harding, female    DOB: [birth year], 31 y.o.   MRN: [id number] ? ?HPI ? ?Not seen in a while and after discussion decided to do wellness exam. ? ?Pt  asking for hormone levels. She is obese and has concerns for pcos/infertility. ? ?LMP- was in December. She does want to get pregnant eventually. Pt states in past had [place] but no cyst seen. Pt think she may have pcos.  ? ?She states will only have steady cycles when on birth control.  ? ?Last pap done march 2021. ? ? ?Declined flu, tdap and covid vaccine. ? ? ? ? ?Review of Systems  ?Constitutional:  Negative for chills, fatigue and fever.  ?HENT:  Negative for congestion and drooling.   ?Respiratory:  Negative for cough, chest tightness, shortness of breath and wheezing.   ?Cardiovascular:  Negative for chest pain and palpitations.  ?Gastrointestinal:  Negative for abdominal pain, diarrhea, nausea and vomiting.  ?Genitourinary:  Negative for decreased urine volume, difficulty urinating, enuresis, flank pain, genital sores, pelvic pain and vaginal bleeding.  ?Musculoskeletal:  Negative for back pain and myalgias.  ?Neurological:  Negative for seizures, facial asymmetry and numbness.  ?Hematological:  Negative for adenopathy. Does not bruise/bleed easily.  ?Psychiatric/Behavioral:  Negative for behavioral problems and dysphoric mood.   ? ? ?Past Medical History:  ?Diagnosis Date  ? Amenorrhea   ? Dysmenorrhea   ? ?  ?Social History  ? ?Socioeconomic History  ? Marital status: Single  ?  Spouse name: Not on file  ? Number of children: Not on file  ? Years of education: Not on file  ? Highest education level: Not on file  ?Occupational History  ? Not on file  ?Tobacco Use  ? Smoking status: Never  ? Smokeless tobacco: Never  ?Vaping Use  ? Vaping Use: Never used  ?Substance and Sexual Activity  ? Alcohol use: Yes  ?  Comment: 2 drinks every 2 weeks.  ? Drug use: Never  ? Sexual activity: Yes  ?  Birth control/protection: None  ?Other  Topics Concern  ? Not on file  ?Social History Narrative  ? Not on file  ? ?Social Determinants of Health  ? ?Financial Resource Strain: Not on file  ?Food Insecurity: Not on file  ?Transportation Needs: Not on file  ?Physical Activity: Not on file  ?Stress: Not on file  ?Social Connections: Not on file  ?Intimate Partner Violence: Not on file  ? ? ?No past surgical history on file. ? ?Family History  ?Problem Relation Age of Onset  ? Migraines Mother   ? Breast cancer Maternal Grandmother 36  ? Cancer Maternal Grandmother   ? Hypertension Maternal Grandmother   ? Heart failure Paternal Grandmother   ? ? ?No Known Allergies ? ?No current outpatient medications on file prior to visit.  ? ?No current facility-administered medications on file prior to visit.  ? ? ?BP 130/70   Pulse 72   Resp 18   Ht 5\' 8"  (1.727 m)   Wt 281 lb (127.5 kg)   SpO2 100%   BMI 42.73 kg/m?  ?  ?   ?Objective:  ? Physical Exam ? ?General ?Mental Status- Alert. General Appearance- Not in acute distress.  ? ?Skin ?General: Color- Normal Color. Moisture- Normal Moisture. ? ?Neck ?Carotid Arteries- Normal color. Moisture- Normal Moisture. No carotid bruits. No JVD. ? ?Chest and Lung Exam ?Auscultation: ?Breath Sounds:-Normal. ? ?Cardiovascular ?Auscultation:Rythm- Regular. ?Murmurs & Other Heart  Sounds:Auscultation of the heart reveals- No Murmurs. ? ?Abdomen ?Inspection:-Inspeection Normal. ?Palpation/Percussion:Note:No mass. Palpation and Percussion of the abdomen reveal- Non Tender, Non Distended + BS, no rebound or guarding. ? ? ?Neurologic ?Cranial Nerve exam:- CN III-XII intact(No nystagmus), symmetric smile. ?Finger to Nose:- Normal/Intact ?Strength:- 5/5 equal and symmetric strength both upper and lower extremities.  ? ? ?   ?Assessment & Plan:  ? ?Patient Instructions  ?For you wellness exam today I have ordered cbc, cmp and lipid panel. ? ?Vaccine declined ? ?For fatigue will get tsh, t4, iron level and b12. ? ?Recommend exercise  and healthy diet. ? ?We will let you know lab results as they come in. ? ?For late menses, fertility concernes,  and prior irregular menses  will get pregnancy test and  fsh level. ? ?Follow up date appointment will be determined after lab review.   ? ? ?  ?[name], PA-C  ? ?[phone] lab as did address, fatigue and  infertility concerns. ?

## 2021-05-29 LAB — IRON: Iron: 83 ug/dL (ref 42–145)

## 2021-05-29 LAB — LIPID PANEL
Cholesterol: 161 mg/dL (ref 0–200)
HDL: 38.2 mg/dL — ABNORMAL LOW (ref >39.00)
LDL Cholesterol: 91 mg/dL (ref 0–99)
NonHDL: 122.65
Total CHOL/HDL Ratio: 4
Triglycerides: 156 mg/dL — ABNORMAL HIGH (ref 0.0–149.0)
VLDL: 31.2 mg/dL (ref 0.0–40.0)

## 2021-05-29 LAB — COMPREHENSIVE METABOLIC PANEL
ALT: 27 U/L (ref 0–35)
AST: 20 U/L (ref 0–37)
Albumin: 4.5 g/dL (ref 3.5–5.2)
Alkaline Phosphatase: 44 U/L (ref 39–117)
BUN: 8 mg/dL (ref 6–23)
CO2: 31 mEq/L (ref 19–32)
Calcium: 9.7 mg/dL (ref 8.4–10.5)
Chloride: 100 mEq/L (ref 96–112)
Creatinine, Ser: 0.67 mg/dL (ref 0.40–1.20)
GFR: 118.71 mL/min (ref >60.00)
Glucose, Bld: 79 mg/dL (ref 70–99)
Potassium: 4.1 mEq/L (ref 3.5–5.1)
Sodium: 138 mEq/L (ref 135–145)
Total Bilirubin: 0.3 mg/dL (ref 0.2–1.2)
Total Protein: 7.3 g/dL (ref 6.0–8.3)

## 2021-05-29 LAB — CBC WITH DIFFERENTIAL/PLATELET
Basophils Absolute: 0.1 10*3/uL (ref 0.0–0.1)
Basophils Relative: 0.7 % (ref 0.0–3.0)
Eosinophils Absolute: 0.1 10*3/uL (ref 0.0–0.7)
Eosinophils Relative: 1.3 % (ref 0.0–5.0)
HCT: 38.4 % (ref 36.0–46.0)
Hemoglobin: 12.5 g/dL (ref 12.0–15.0)
Lymphocytes Relative: 38.9 % (ref 12.0–46.0)
Lymphs Abs: 3.2 10*3/uL (ref 0.7–4.0)
MCHC: 32.5 g/dL (ref 30.0–36.0)
MCV: 85.4 fl (ref 78.0–100.0)
Monocytes Absolute: 0.6 10*3/uL (ref 0.1–1.0)
Monocytes Relative: 7.6 % (ref 3.0–12.0)
Neutro Abs: 4.3 10*3/uL (ref 1.4–7.7)
Neutrophils Relative %: 51.5 % (ref 43.0–77.0)
Platelets: 276 10*3/uL (ref 150.0–400.0)
RBC: 4.5 Mil/uL (ref 3.87–5.11)
RDW: 12.7 % (ref 11.5–15.5)
WBC: 8.3 10*3/uL (ref 4.0–10.5)

## 2021-05-29 LAB — FOLLICLE STIMULATING HORMONE: FSH: 3.7 m[IU]/mL

## 2021-05-29 LAB — VITAMIN B12: Vitamin B-12: 362 pg/mL (ref 211–911)

## 2021-05-29 LAB — T4, FREE: Free T4: 0.9 ng/dL (ref 0.60–1.60)

## 2021-05-29 LAB — TSH: TSH: 2.59 u[IU]/mL (ref 0.35–5.50)

## 2021-05-29 NOTE — Addendum Note (Signed)
Addended by: Anabel Halon on: 05/29/2021 05:30 PM ? ? Modules accepted: Orders ? ?

## 2021-06-01 ENCOUNTER — Telehealth: Payer: Self-pay

## 2021-06-01 NOTE — Telephone Encounter (Signed)
Pt says: ? ?She was under the impression that std testing would be ran with her labs.  ?2.    She was told that follow up in regard to the Metformin would be had after her labs were done. Glucose was 79. Please advise.  ?

## 2021-06-02 NOTE — Addendum Note (Signed)
Addended by: Anabel Halon on: 06/02/2021 07:59 AM ? ? Modules accepted: Orders ? ?

## 2021-06-04 DIAGNOSIS — M62830 Muscle spasm of back: Secondary | ICD-10-CM | POA: Diagnosis not present

## 2021-06-04 NOTE — Telephone Encounter (Signed)
See below

## 2021-06-05 NOTE — Telephone Encounter (Signed)
Lab appt made

## 2021-06-11 ENCOUNTER — Other Ambulatory Visit: Payer: BC Managed Care – PPO

## 2021-06-12 ENCOUNTER — Ambulatory Visit: Payer: BC Managed Care – PPO | Admitting: Family

## 2021-06-12 ENCOUNTER — Other Ambulatory Visit: Payer: BC Managed Care – PPO

## 2021-06-12 ENCOUNTER — Encounter: Payer: Self-pay | Admitting: Family

## 2021-06-12 VITALS — BP 130/70 | HR 96 | Temp 98.4°F | Ht 68.0 in | Wt 286.0 lb

## 2021-06-12 DIAGNOSIS — Z113 Encounter for screening for infections with a predominantly sexual mode of transmission: Secondary | ICD-10-CM

## 2021-06-12 DIAGNOSIS — N926 Irregular menstruation, unspecified: Secondary | ICD-10-CM

## 2021-06-12 DIAGNOSIS — M542 Cervicalgia: Secondary | ICD-10-CM | POA: Diagnosis not present

## 2021-06-12 MED ORDER — PREDNISONE 20 MG PO TABS: ORAL_TABLET | ORAL | 0 refills | Status: DC

## 2021-06-12 NOTE — Progress Notes (Signed)
?  Kathleen Harding is a 31 y.o. female with the following history as recorded in EpicCare:  ?Patient Active Problem List  ? Diagnosis Date Noted  ? Irregular periods 06/04/2019  ? History of hidradenitis suppurativa 06/04/2019  ? Primary oligomenorrhea 06/04/2019  ? BMI 37.0-37.9, adult 06/04/2019  ? PCOS (polycystic ovarian syndrome) 06/04/2019  ?  ?Current Outpatient Medications  ?Medication Sig Dispense Refill  ? ibuprofen (ADVIL) 800 MG tablet Take 800 mg by mouth every 8 (eight) hours as needed.    ? predniSONE (DELTASONE) 20 MG tablet Take 2 tablet daily x 2 days, then 1 tablet daily x 5 days 9 tablet 0  ? ?No current facility-administered medications for this visit.  ?  ?Allergies: Patient has no known allergies.  ?Past Medical History:  ?Diagnosis Date  ? Amenorrhea   ? Dysmenorrhea   ?  ?No past surgical history on file.  ?Family History  ?Problem Relation Age of Onset  ? Migraines Mother   ? Breast cancer Maternal Grandmother 38  ? Cancer Maternal Grandmother   ? Hypertension Maternal Grandmother   ? Heart failure Paternal Grandmother   ?  ?Social History  ? ?Tobacco Use  ? Smoking status: Never  ? Smokeless tobacco: Never  ?Substance Use Topics  ? Alcohol use: Yes  ?  Comment: 2 drinks every 2 weeks.  ?  ?Subjective:  ?Left shoulder pain x 1 week; went to [facility] last week- limited relief with muscle relaxer and Ibuprofen; does feel symptoms radiating down into left hand; no numbness or known injury;  ? ?LMP 3 months ago- pregnancy test negative earlier this month;  ? ? ? ? ?Objective:  ?Vitals:  ? 06/12/21 1455  ?BP: 130/70  ?Pulse: 96  ?Temp: 98.4 ?F (36.9 ?C)  ?TempSrc: Oral  ?SpO2: 98%  ?Weight: 286 lb (129.7 kg)  ?Height: 5\' 8"  (1.727 m)  ?  ?General: Well developed, well nourished, in no acute distress  ?Skin : Warm and dry.  ?Head: Normocephalic and atraumatic  ?Lungs: Respirations unlabored;  ?Musculoskeletal: No deformities; no active joint inflammation; spasm noted over left trap muscle ?Extremities:  No edema, cyanosis, clubbing  ?Vessels: Symmetric bilaterally  ?Neurologic: Alert and oriented; speech intact; face symmetrical; moves all extremities well; CNII-XII intact without focal deficit  ? ?Assessment:  ?1. Irregular periods   ?2. Screen for STD (sexually transmitted disease)   ?3. Neck pain on left side   ?  ?Plan:  ?Refer to GYN; ?Labs done per PCP today; ?Rx for Prednisone- take as directed; continue muscle relaxer; apply heat; follow up worse, no better;  ? ?This visit occurred during the SARS-CoV-2 public health emergency.  Safety protocols were in place, including screening questions prior to the visit, additional usage of staff PPE, and extensive cleaning of exam room while observing appropriate contact time as indicated for disinfecting solutions.  ? ? ?No follow-ups on file.  ?Orders Placed This Encounter  ?Procedures  ? Ambulatory referral to Obstetrics / Gynecology  ?  Referral Priority:   Routine  ?  Referral Type:   Consultation  ?  Referral Reason:   Specialty Services Required  ?  Requested Specialty:   Obstetrics and Gynecology  ?  Number of Visits Requested:   1  ?  ?Requested Prescriptions  ? ?Signed Prescriptions Disp Refills  ? predniSONE (DELTASONE) 20 MG tablet 9 tablet 0  ?  Sig: Take 2 tablet daily x 2 days, then 1 tablet daily x 5 days  ?  ? ?

## 2021-06-13 LAB — HIV ANTIBODY (ROUTINE TESTING W REFLEX): HIV 1&2 Ab, 4th Generation: NONREACTIVE

## 2021-06-13 LAB — RPR: RPR Ser Ql: NONREACTIVE

## 2021-09-22 IMAGING — US US EXTREM LOW VENOUS*L*
1 series · 13 of 24 positions shown · non-contrast
Comparison: None.

CLINICAL DATA: Left popliteal and mid calf pain.

EXAM:
LEFT LOWER EXTREMITY VENOUS DOPPLER ULTRASOUND
TECHNIQUE: Gray-scale sonography with compression, as well as color and duplex
ultrasound, were performed to evaluate the deep venous system(s)
from the level of the common femoral vein through the popliteal and
proximal calf veins.

[Series 1: us extrem low venous*left* · 13 of 32 slices shown]
[im 1/32]
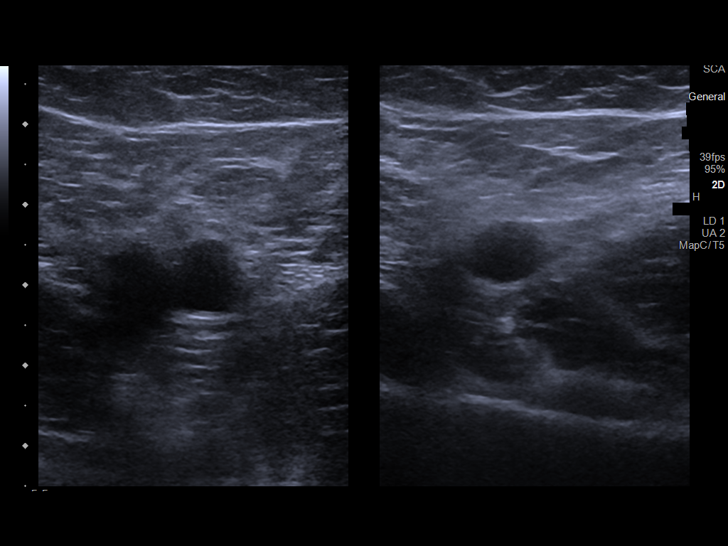
[im 3/32]
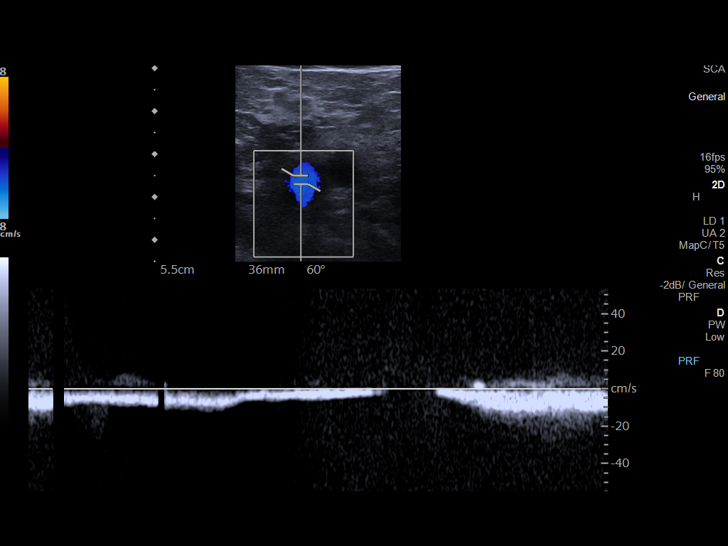
[im 6/32]
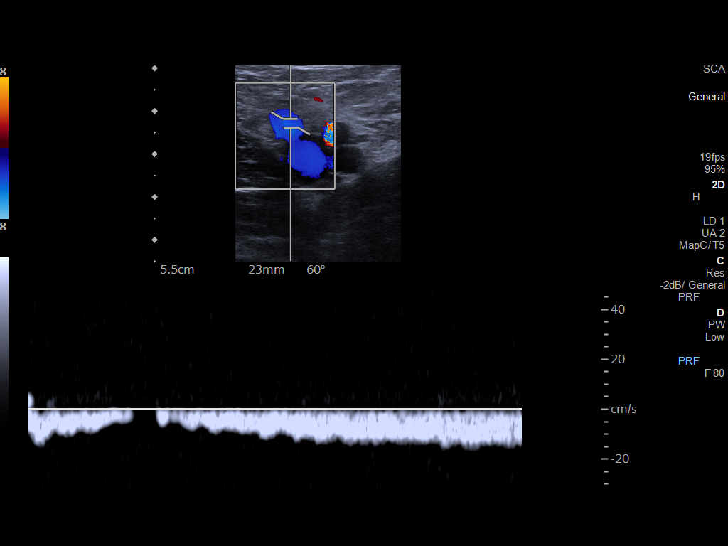
[im 9/32]
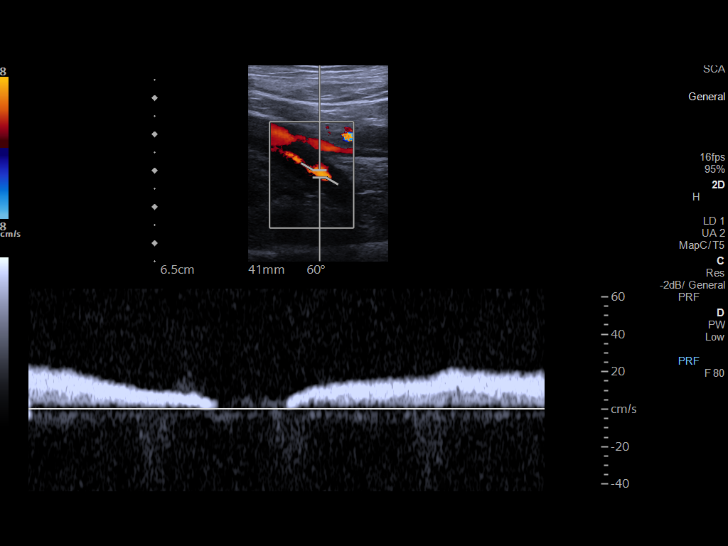
[im 11/32]
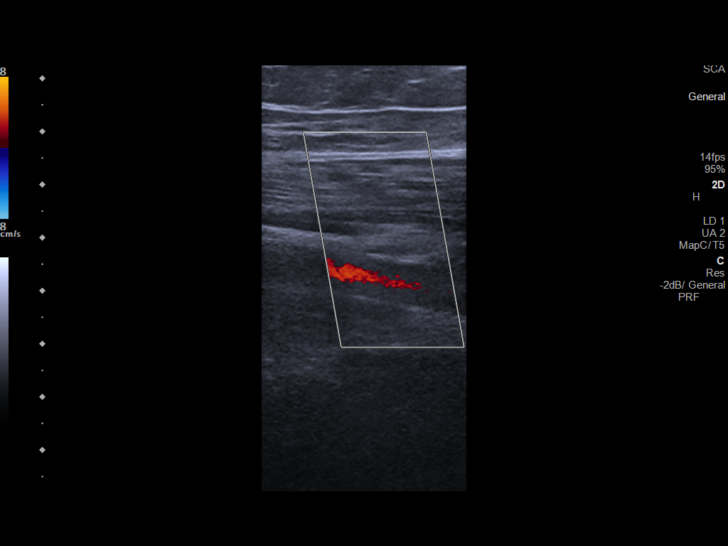
[im 14/32]
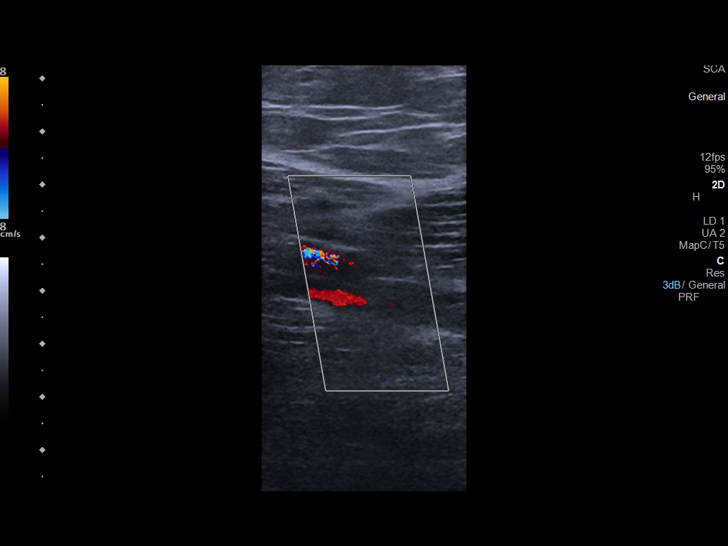
[im 17/32]
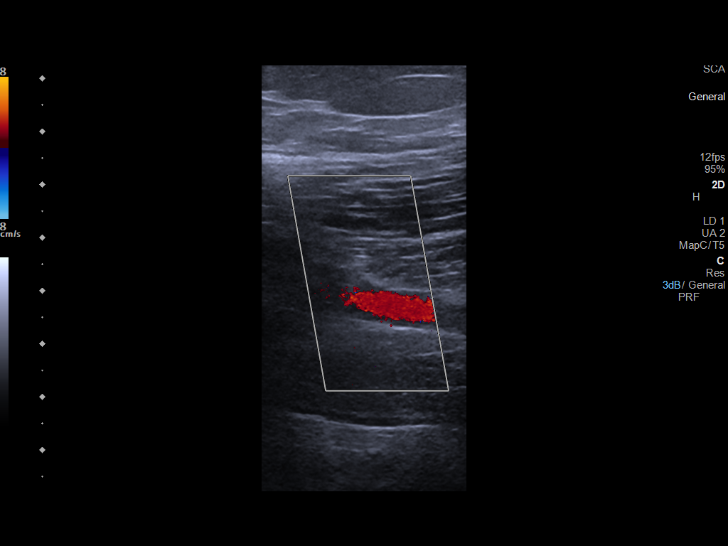
[im 18/32]
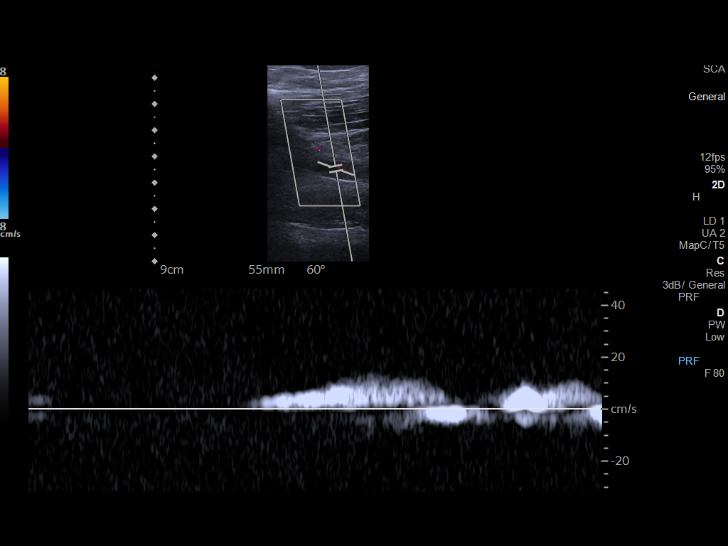
[im 21/32]
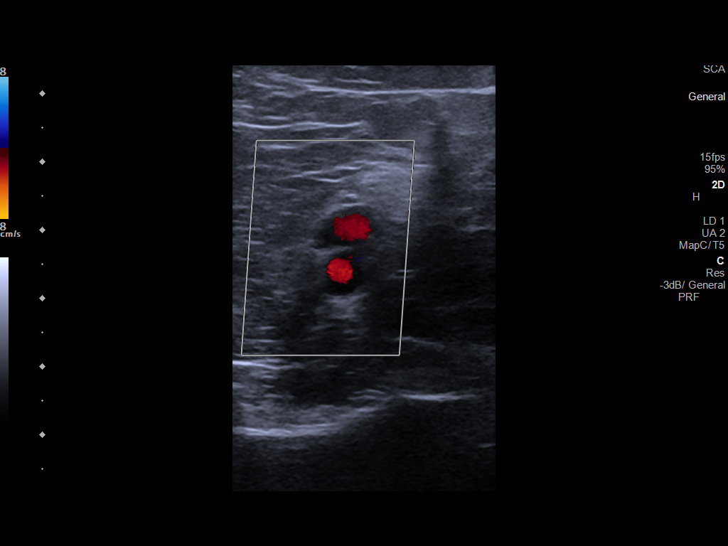
[im 23/32]
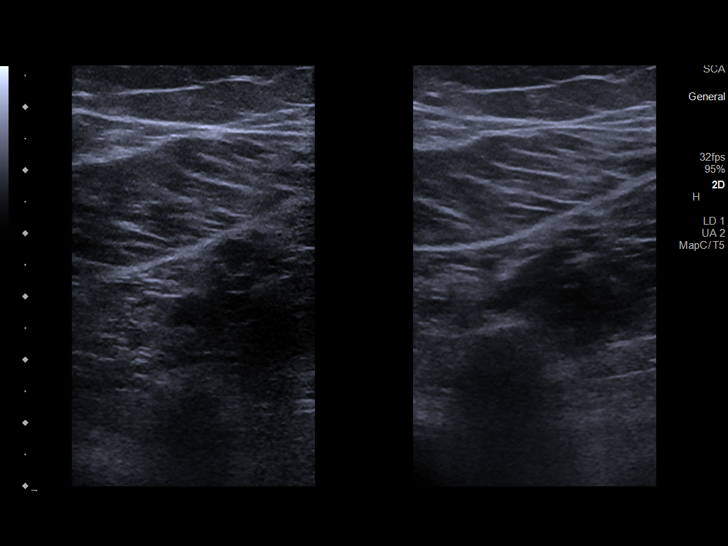
[im 26/32]
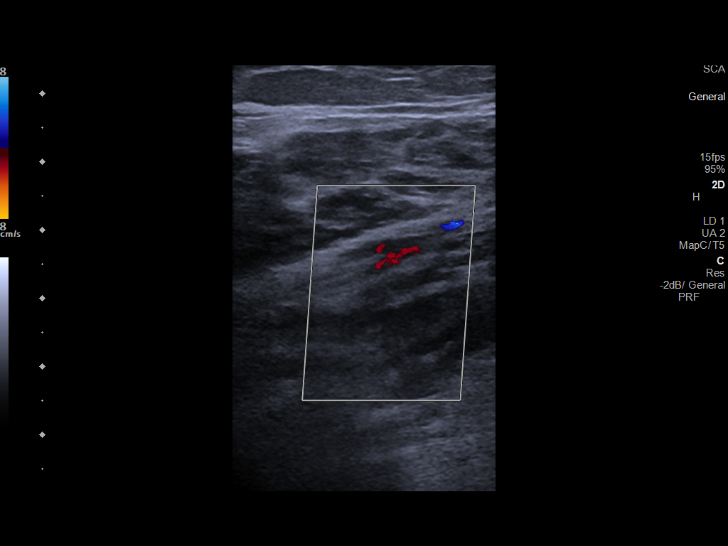
[im 29/32]
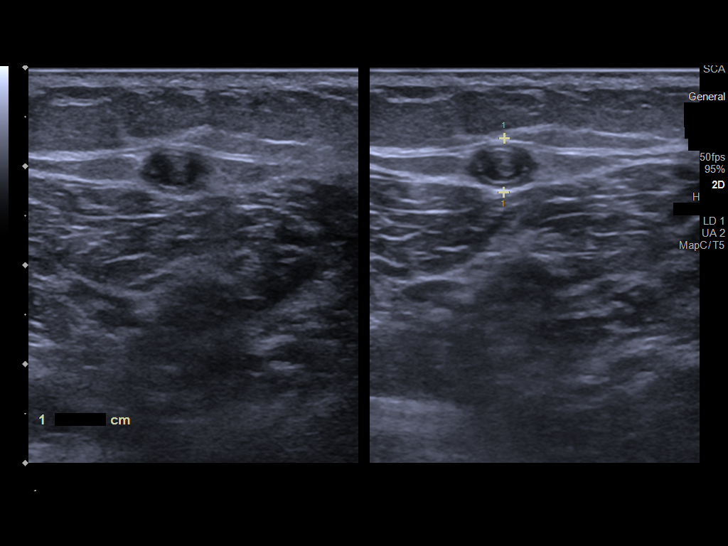
[im 32/32]
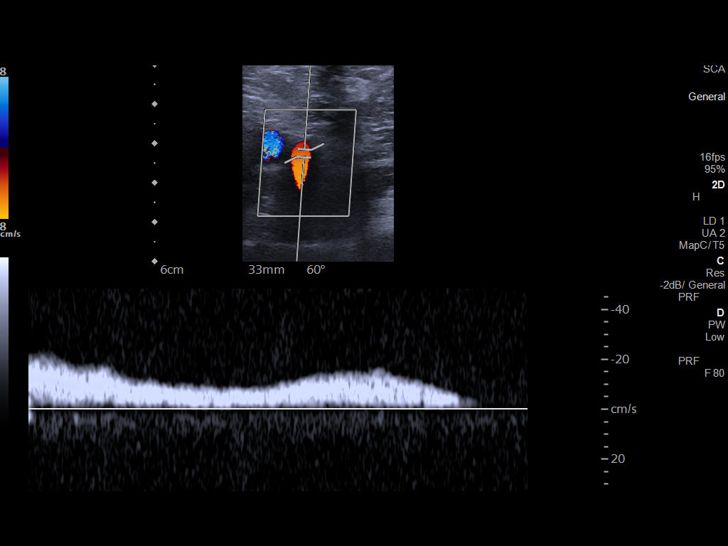

[13 of 24 positions shown; findings below may reference images not displayed]

FINDINGS: VENOUS

Normal compressibility of the common femoral, superficial femoral,
and popliteal veins, as well as the visualized deep calf veins.
Visualized portions of profunda femoral vein and great saphenous
vein unremarkable. Doppler waveforms show normal direction of venous
flow, normal respiratory plasticity and response to augmentation.

There is acute expansile occlusive thrombosis of the small saphenous
vein in the left calf.

Limited views of the contralateral common femoral vein are
unremarkable.

OTHER

None.

Limitations: none
IMPRESSION: 1. Acute occlusive superficial thrombophlebitis of the small
saphenous vein in the left calf.
2. No deep venous thrombosis in the left lower extremity.

These results will be called to the ordering clinician or
representative by the [HOSPITAL] at the imaging location.

## 2021-12-21 ENCOUNTER — Telehealth: Payer: Self-pay | Admitting: Medical

## 2021-12-21 NOTE — Telephone Encounter (Signed)
Patient called to request prescription for BV. I advised she would need an appt, but patient stated she has been able to call in before and just get the prescription without an appt.   Please send prescription to [facility], [place] AT [place] [facility] [address], [place] [address] Phone: [phone]  Fax: [phone]

## 2021-12-24 NOTE — Telephone Encounter (Signed)
Pt stated she would call back and make an appointment

## 2022-06-06 DIAGNOSIS — L732 Hidradenitis suppurativa: Secondary | ICD-10-CM | POA: Diagnosis not present

## 2022-06-06 DIAGNOSIS — L638 Other alopecia areata: Secondary | ICD-10-CM | POA: Diagnosis not present

## 2022-06-06 DIAGNOSIS — L219 Seborrheic dermatitis, unspecified: Secondary | ICD-10-CM | POA: Diagnosis not present

## 2022-06-10 ENCOUNTER — Ambulatory Visit: Payer: BC Managed Care – PPO | Admitting: Family Medicine

## 2022-07-05 ENCOUNTER — Ambulatory Visit (INDEPENDENT_AMBULATORY_CARE_PROVIDER_SITE_OTHER)
Admission: RE | Admit: 2022-07-05 | Discharge: 2022-07-05 | Disposition: A | Discharge status: Home / Self Care | Payer: BC Managed Care – PPO | Source: Ambulatory Visit | Attending: Family | Admitting: Family

## 2022-07-05 ENCOUNTER — Encounter: Payer: Self-pay | Admitting: Family

## 2022-07-05 ENCOUNTER — Other Ambulatory Visit (HOSPITAL_COMMUNITY)
Admission: RE | Admit: 2022-07-05 | Discharge: 2022-07-05 | Disposition: A | Discharge status: Home / Self Care | Payer: BC Managed Care – PPO | Source: Ambulatory Visit | Attending: Medical | Admitting: Medical

## 2022-07-05 ENCOUNTER — Ambulatory Visit: Payer: BC Managed Care – PPO | Admitting: Family

## 2022-07-05 VITALS — BP 114/80 | HR 79 | Resp 18 | Ht 68.0 in | Wt 285.0 lb

## 2022-07-05 DIAGNOSIS — R0789 Other chest pain: Secondary | ICD-10-CM | POA: Diagnosis not present

## 2022-07-05 DIAGNOSIS — N761 Subacute and chronic vaginitis: Secondary | ICD-10-CM | POA: Insufficient documentation

## 2022-07-05 DIAGNOSIS — M545 Low back pain, unspecified: Secondary | ICD-10-CM | POA: Diagnosis not present

## 2022-07-05 DIAGNOSIS — R002 Palpitations: Secondary | ICD-10-CM | POA: Diagnosis not present

## 2022-07-05 DIAGNOSIS — M47816 Spondylosis without myelopathy or radiculopathy, lumbar region: Secondary | ICD-10-CM | POA: Diagnosis not present

## 2022-07-05 DIAGNOSIS — M5137 Other intervertebral disc degeneration, lumbosacral region: Secondary | ICD-10-CM | POA: Diagnosis not present

## 2022-07-05 LAB — COMPREHENSIVE METABOLIC PANEL
ALT: 31 U/L (ref 0–35)
AST: 21 U/L (ref 0–37)
Albumin: 4.4 g/dL (ref 3.5–5.2)
Alkaline Phosphatase: 52 U/L (ref 39–117)
BUN: 7 mg/dL (ref 6–23)
CO2: 29 mEq/L (ref 19–32)
Calcium: 9.4 mg/dL (ref 8.4–10.5)
Chloride: 101 mEq/L (ref 96–112)
Creatinine, Ser: 0.77 mg/dL (ref 0.40–1.20)
GFR: 103.96 mL/min (ref >60.00)
Glucose, Bld: 75 mg/dL (ref 70–99)
Potassium: 4.2 mEq/L (ref 3.5–5.1)
Sodium: 138 mEq/L (ref 135–145)
Total Bilirubin: 0.5 mg/dL (ref 0.2–1.2)
Total Protein: 7.5 g/dL (ref 6.0–8.3)

## 2022-07-05 LAB — CBC WITH DIFFERENTIAL/PLATELET
Basophils Absolute: 0.1 10*3/uL (ref 0.0–0.1)
Basophils Relative: 0.6 % (ref 0.0–3.0)
Eosinophils Absolute: 0.1 10*3/uL (ref 0.0–0.7)
Eosinophils Relative: 0.9 % (ref 0.0–5.0)
HCT: 39.1 % (ref 36.0–46.0)
Hemoglobin: 12.8 g/dL (ref 12.0–15.0)
Lymphocytes Relative: 37.4 % (ref 12.0–46.0)
Lymphs Abs: 3.4 10*3/uL (ref 0.7–4.0)
MCHC: 32.7 g/dL (ref 30.0–36.0)
MCV: 85.5 fl (ref 78.0–100.0)
Monocytes Absolute: 0.6 10*3/uL (ref 0.1–1.0)
Monocytes Relative: 6.3 % (ref 3.0–12.0)
Neutro Abs: 4.9 10*3/uL (ref 1.4–7.7)
Neutrophils Relative %: 54.8 % (ref 43.0–77.0)
Platelets: 317 10*3/uL (ref 150.0–400.0)
RBC: 4.58 Mil/uL (ref 3.87–5.11)
RDW: 12.8 % (ref 11.5–15.5)
WBC: 9 10*3/uL (ref 4.0–10.5)

## 2022-07-05 LAB — TSH: TSH: 2.51 u[IU]/mL (ref 0.35–5.50)

## 2022-07-05 MED ORDER — METRONIDAZOLE 500 MG PO TABS: 500.0000 mg | ORAL_TABLET | Freq: Two times a day (BID) | ORAL | 0 refills | Status: AC

## 2022-07-05 MED ORDER — MELOXICAM 15 MG PO TABS: 15.0000 mg | ORAL_TABLET | Freq: Every day | ORAL | 0 refills | Status: AC

## 2022-07-05 NOTE — Progress Notes (Signed)
Kathleen Harding is a 32 y.o. female with the following history as recorded in EpicCare:  Patient Active Problem List   Diagnosis Date Noted   Irregular periods 06/04/2019   History of hidradenitis suppurativa 06/04/2019   Primary oligomenorrhea 06/04/2019   BMI 37.0-37.9, adult 06/04/2019   PCOS (polycystic ovarian syndrome) 06/04/2019    Current Outpatient Medications  Medication Sig Dispense Refill   meloxicam (MOBIC) 15 MG tablet Take 1 tablet (15 mg total) by mouth daily. 30 tablet 0   metroNIDAZOLE (FLAGYL) 500 MG tablet Take 1 tablet (500 mg total) by mouth 2 (two) times daily for 7 days. 14 tablet 0   No current facility-administered medications for this visit.    Allergies: Patient has no known allergies.  Past Medical History:  Diagnosis Date   Amenorrhea    Dysmenorrhea     No past surgical history on file.  Family History  Problem Relation Age of Onset   Migraines Mother    Breast cancer Maternal Grandmother 86   Cancer Maternal Grandmother    Hypertension Maternal Grandmother    Heart failure Paternal Grandmother     Social History   Tobacco Use   Smoking status: Never   Smokeless tobacco: Never  Substance Use Topics   Alcohol use: Yes    Comment: 2 drinks every 2 weeks.    Subjective:   Concerned for bacterial vaginosis- symptoms x 2 weeks; no concerns for STD exposure; is prone to BV;  LMP- April 5  Low back pain x 2 days- notes that "just started hurting." Feels different than my sciatica; maybe taking 1-2 Ibuprofen per day;   Also mentions "chest pain" for the past week- "felt like constant flutters" but symptoms have since resolved;      Objective:  Vitals:   07/05/22 1307  BP: 114/80  Pulse: 79  Resp: 18  SpO2: 99%  Weight: 285 lb (129.3 kg)  Height: 5\' 8"  (1.727 m)    General: Well developed, well nourished, in no acute distress  Skin : Warm and dry.  Head: Normocephalic and atraumatic  Eyes: Sclera and conjunctiva clear; pupils  round and reactive to light; extraocular movements intact  Ears: External normal; canals clear; tympanic membranes normal  Oropharynx: Pink, supple. No suspicious lesions  Neck: Supple without thyromegaly, adenopathy  Lungs: Respirations unlabored; clear to auscultation bilaterally without wheeze, rales, rhonchi  CVS exam: normal rate and regular rhythm.  Abdomen: Soft; nontender; nondistended; normoactive bowel sounds; no masses or hepatosplenomegaly  Musculoskeletal: No deformities; no active joint inflammation  Neurologic: Alert and oriented; speech intact; face symmetrical; moves all extremities well; CNII-XII intact without focal deficit   Assessment:  1. Low back pain without sciatica, unspecified back pain laterality, unspecified chronicity   2. Palpitations   3. Chronic vaginitis   4. Other chest pain   5. Atypical chest pain     Plan:  Suspect muscular; update lumbar X-ray; trial of Mobic 15 mg daily; may need to consider referral to PT; Update EKG- sinus rhythm; check CBC, CMP, TSH; refer to cardiology- may need to wear holter monitor; Check vaginal swab; Rx for Flagyl 500 mg bid x 7 days; no alcohol while on medication; she is given contact information for her GYN and encouraged to schedule follow up.  5.   Will update CXR today;   No follow-ups on file.  Orders Placed This Encounter  Procedures   DG Lumbar Spine Complete    Standing Status:   Future  Number of Occurrences:   1    Standing Expiration Date:   07/05/2023    Order Specific Question:   Reason for Exam (SYMPTOM  OR DIAGNOSIS REQUIRED)    Answer:   low back pain    Order Specific Question:   Is patient pregnant?    Answer:   No    Order Specific Question:   Preferred imaging location?    Answer:   [facility]   DG Chest 2 View    Standing Status:   Future    Number of Occurrences:   1    Standing Expiration Date:   07/05/2023    Order Specific Question:   Reason for Exam (SYMPTOM  OR DIAGNOSIS  REQUIRED)    Answer:   atypical chest pain    Order Specific Question:   Is patient pregnant?    Answer:   No    Order Specific Question:   Preferred imaging location?    Answer:   [address]   CBC with Differential/Platelet   Comp Met (CMET)   TSH   EKG 12-Lead    Requested Prescriptions   Signed Prescriptions Disp Refills   meloxicam (MOBIC) 15 MG tablet 30 tablet 0    Sig: Take 1 tablet (15 mg total) by mouth daily.   metroNIDAZOLE (FLAGYL) 500 MG tablet 14 tablet 0    Sig: Take 1 tablet (500 mg total) by mouth 2 (two) times daily for 7 days.

## 2022-07-05 NOTE — Patient Instructions (Addendum)
I don't see any obvious concerning changes on your EKG but I don't like patients to have "chest flutters." I would recommend that you see cardiology for further evaluation. They will have you wear a heart monitor to make sure there is not any concerning rhythm with your heart. I am also going to have you get a CXR when you go for you back X-ray today.    The address for the X-rays is [address] [place], [place]; open 8 am-5 pm; no appointment required

## 2022-07-08 ENCOUNTER — Other Ambulatory Visit: Payer: Self-pay | Admitting: Family

## 2022-07-08 DIAGNOSIS — R002 Palpitations: Secondary | ICD-10-CM

## 2022-07-08 LAB — CERVICOVAGINAL ANCILLARY ONLY
Bacterial Vaginitis (gardnerella): NEGATIVE
Candida Glabrata: NEGATIVE
Candida Vaginitis: NEGATIVE
Chlamydia: NEGATIVE
Comment: NEGATIVE
Comment: NEGATIVE
Comment: NEGATIVE
Comment: NEGATIVE
Comment: NEGATIVE
Comment: NORMAL
Neisseria Gonorrhea: NEGATIVE
Trichomonas: NEGATIVE

## 2022-07-22 ENCOUNTER — Ambulatory Visit: Payer: BC Managed Care – PPO | Admitting: Internal Medicine

## 2022-08-01 ENCOUNTER — Other Ambulatory Visit: Payer: Self-pay | Admitting: Family

## 2023-05-08 ENCOUNTER — Telehealth: Payer: Self-pay | Admitting: Medical

## 2023-05-08 NOTE — Telephone Encounter (Signed)
 LVM to reschedule due to appt is for a cpe and cpe is needing to be earlier due to labs - lab closes at 3:30 and needing to make appt earlier for another day if possible. Verify also if pt is to do cpe only and has another concern, then we can leave appt for OV for other concern and schedule cpe for another day.

## 2023-05-09 ENCOUNTER — Telehealth: Payer: Self-pay

## 2023-05-09 ENCOUNTER — Ambulatory Visit: Payer: BC Managed Care – PPO | Admitting: Medical

## 2023-05-09 ENCOUNTER — Other Ambulatory Visit (HOSPITAL_COMMUNITY)
Admission: RE | Admit: 2023-05-09 | Discharge: 2023-05-09 | Disposition: A | Discharge status: Home / Self Care | Payer: BC Managed Care – PPO | Source: Ambulatory Visit | Attending: Medical | Admitting: Medical

## 2023-05-09 VITALS — BP 128/86 | HR 80 | Resp 18 | Ht 68.0 in | Wt 290.0 lb

## 2023-05-09 DIAGNOSIS — N761 Subacute and chronic vaginitis: Secondary | ICD-10-CM

## 2023-05-09 DIAGNOSIS — Z113 Encounter for screening for infections with a predominantly sexual mode of transmission: Secondary | ICD-10-CM | POA: Diagnosis not present

## 2023-05-09 DIAGNOSIS — F32A Depression, unspecified: Secondary | ICD-10-CM | POA: Diagnosis not present

## 2023-05-09 DIAGNOSIS — F419 Anxiety disorder, unspecified: Secondary | ICD-10-CM | POA: Diagnosis not present

## 2023-05-09 DIAGNOSIS — N898 Other specified noninflammatory disorders of vagina: Secondary | ICD-10-CM

## 2023-05-09 MED ORDER — VENLAFAXINE HCL ER 37.5 MG PO CP24: 37.5000 mg | ORAL_CAPSULE | Freq: Every day | ORAL | 0 refills | Status: AC

## 2023-05-09 MED ORDER — METRONIDAZOLE 500 MG PO TABS: 500.0000 mg | ORAL_TABLET | Freq: Three times a day (TID) | ORAL | 0 refills | Status: AC

## 2023-05-09 MED ORDER — VENLAFAXINE HCL ER 37.5 MG PO CP24: 37.5000 mg | ORAL_CAPSULE | Freq: Every day | ORAL | 0 refills | Status: DC

## 2023-05-09 NOTE — Progress Notes (Signed)
 Subjective:    Patient ID: Kathleen Harding, female    DOB: 19-Mar-1990, 33 y.o.   MRN: [id number]  HPI Discussed the use of AI scribe software for clinical note transcription with the patient, who gave verbal consent to proceed.  History of Present Illness   Kathleen Harding is a 33 year old female who presents with vaginal odor.  She thinks  has been experiencing symptoms   bacterial vagininosis for approximately two weeks, characterized by a noticeable odor without discharge. No burning sensation or frequent urination is present. She has a history of bacterial vaginosis with similar symptoms in the past, which improved with previous treatment. No fevers, chills, sweats, bladder pain, pelvic pain, or kidney pain are reported. Her last menstrual cycle was on February 1st.   She also request std screening.  She has been experiencing high levels of anxiety for the past two to three months, attributed to her job in [employer] and a recent family bereavement. The anxiety comes in waves, particularly when she has to go to work. She reports constant fatigue and occasional high level anxiety but on further discussion she thinks maybe not panic attacks. She has not been on medication for depression or anxiety before. Her GAD-7 score is 13, indicating anxiety, and her PHQ-9 score is 16, indicating depression.           Lmp-Apr 12, 2023   Review of Systems  Constitutional:  Positive for fatigue.  HENT:  Negative for congestion, ear discharge and ear pain.   Respiratory:  Negative for cough, chest tightness, shortness of breath and wheezing.   Cardiovascular:  Negative for chest pain and palpitations.  Gastrointestinal:  Negative for abdominal pain, blood in stool, constipation and nausea.  Musculoskeletal:  Negative for back pain and joint swelling.   Past Medical History:  Diagnosis Date   Amenorrhea    Dysmenorrhea      Social History   Socioeconomic History   Marital status: Single    Spouse  name: Not on file   Number of children: Not on file   Years of education: Not on file   Highest education level: Not on file  Occupational History   Not on file  Tobacco Use   Smoking status: Never   Smokeless tobacco: Never  Vaping Use   Vaping status: Never Used  Substance and Sexual Activity   Alcohol use: Yes    Comment: 2 drinks every 2 weeks.   Drug use: Never   Sexual activity: Yes    Birth control/protection: None  Other Topics Concern   Not on file  Social History Narrative   Not on file   Social Drivers of Health   Financial Resource Strain: Not on file  Food Insecurity: Not on file  Transportation Needs: Not on file  Physical Activity: Not on file  Stress: Not on file  Social Connections: Not on file  Intimate Partner Violence: Not on file    No past surgical history on file.  Family History  Problem Relation Age of Onset   Migraines Mother    Breast cancer Maternal Grandmother 79   Cancer Maternal Grandmother    Hypertension Maternal Grandmother    Heart failure Paternal Grandmother     No Known Allergies  Current Outpatient Medications on File Prior to Visit  Medication Sig Dispense Refill   meloxicam (MOBIC) 15 MG tablet Take 1 tablet (15 mg total) by mouth daily. 30 tablet 0   No current facility-administered medications  on file prior to visit.    BP 128/86   Pulse 80   Resp 18   Ht 5\' 8"  (1.727 m)   Wt 290 lb (131.5 kg)   LMP 04/12/2023   SpO2 97%   BMI 44.09 kg/m       Objective:   Physical Exam  General- No acute distress. Pleasant patient. Neck- Full range of motion, no jvd Lungs- Clear, even and unlabored. Heart- regular rate and rhythm. Neurologic- CNII- XII grossly intact.       Assessment & Plan:   Assessment and Plan    Patient Instructions  (Vaginal odor with hx of vaginitis. Possible bacterial Vaginosis Recurrent symptoms of odor without discharge for 2 weeks. Prior history of BV. No urinary symptoms or pelvic  pain. -Collect vaginal swab for BV, candida and std organsisms. -Start Flagyl 7-day treatment. -Consider referral to gynecology if symptoms persist or if repeat testing is negative.  Anxiety and Depression High level of work-related anxiety for 2-3 months, exacerbated by recent family bereavement. GAD-7 score of 13 and PHQ-9 score of 16. Reports fatigue and panic attacks. -Start Effexor 37.5mg  daily. -Follow up with Vernona Rieger in 1 month to assess response to medication and consider additional treatment options.  General Health Maintenance -Perform RPR and HIV screening tests as well.   Follow up 1 month or sooner if needed.   [name], PA-C

## 2023-05-09 NOTE — Addendum Note (Signed)
 Addended by: Gwenevere Abbot on: 05/09/2023 04:34 PM   Modules accepted: Orders

## 2023-05-09 NOTE — Telephone Encounter (Signed)
 Pt.notified

## 2023-05-09 NOTE — Patient Instructions (Signed)
(  Vaginal odor with hx of vaginitis. Possible bacterial Vaginosis Recurrent symptoms of odor without discharge for 2 weeks. Prior history of BV. No urinary symptoms or pelvic pain. -Collect vaginal swab for BV, candida and std organsisms. -Start Flagyl 7-day treatment. -Consider referral to gynecology if symptoms persist or if repeat testing is negative.  Anxiety and Depression High level of work-related anxiety for 2-3 months, exacerbated by recent family bereavement. GAD-7 score of 13 and PHQ-9 score of 16. Reports fatigue and panic attacks. -Start Effexor 37.5mg  daily. -Follow up with Vernona Rieger in 1 month to assess response to medication and consider additional treatment options.  General Health Maintenance -Perform RPR and HIV screening tests as well.   Follow up 1 month or sooner if needed.

## 2023-05-09 NOTE — Telephone Encounter (Signed)
 Copied from CRM [id number]. Topic: Clinical - Prescription Issue >> May 09, 2023  2:28 PM Truddie Crumble wrote: Reason for CRM: patient called stating she had an appointment today and the medication- venlafaxine was not at the pharmacy

## 2023-05-10 LAB — HIV ANTIBODY (ROUTINE TESTING W REFLEX): HIV 1&2 Ab, 4th Generation: NONREACTIVE

## 2023-05-10 LAB — RPR: RPR Ser Ql: NONREACTIVE

## 2023-05-11 ENCOUNTER — Encounter: Payer: Self-pay | Admitting: Medical

## 2023-05-12 LAB — CERVICOVAGINAL ANCILLARY ONLY
Bacterial Vaginitis (gardnerella): NEGATIVE
Chlamydia: NEGATIVE
Comment: NEGATIVE
Comment: NEGATIVE
Comment: NEGATIVE
Comment: NORMAL
Neisseria Gonorrhea: NEGATIVE
Trichomonas: NEGATIVE

## 2023-06-16 ENCOUNTER — Other Ambulatory Visit: Payer: Self-pay | Admitting: Medical

## 2023-12-17 ENCOUNTER — Telehealth

## 2023-12-20 ENCOUNTER — Ambulatory Visit (INDEPENDENT_AMBULATORY_CARE_PROVIDER_SITE_OTHER): Payer: PRIVATE HEALTH INSURANCE

## 2023-12-20 ENCOUNTER — Ambulatory Visit
Admission: RE | Admit: 2023-12-20 | Discharge: 2023-12-20 | Disposition: A | Discharge status: Home / Self Care | Payer: PRIVATE HEALTH INSURANCE | Source: Ambulatory Visit | Attending: Internal Medicine

## 2023-12-20 VITALS — BP 124/64 | HR 82 | Temp 98.8°F | Resp 16

## 2023-12-20 DIAGNOSIS — M545 Low back pain, unspecified: Secondary | ICD-10-CM

## 2023-12-20 DIAGNOSIS — M25551 Pain in right hip: Secondary | ICD-10-CM | POA: Diagnosis not present

## 2023-12-20 DIAGNOSIS — M7631 Iliotibial band syndrome, right leg: Secondary | ICD-10-CM | POA: Diagnosis not present

## 2023-12-20 MED ORDER — PREDNISONE 20 MG PO TABS: 40.0000 mg | ORAL_TABLET | Freq: Every day | ORAL | 0 refills | Status: AC

## 2023-12-20 MED ORDER — CYCLOBENZAPRINE HCL 5 MG PO TABS: 5.0000 mg | ORAL_TABLET | Freq: Three times a day (TID) | ORAL | 0 refills | Status: AC | PRN

## 2023-12-20 NOTE — ED Triage Notes (Addendum)
 Pt c/o lower back pain and right hip area x 2 weeks-denies injury-pain is worse with movement and in the am-was taking flexeril and ran out-NAD-slow gait

## 2023-12-20 NOTE — ED Provider Notes (Signed)
 [facility]    CSN: [id number] Arrival date & time: 12/20/23  1033      History   Chief Complaint Chief Complaint  Patient presents with   Back Pain    Entered by patient    HPI Kathleen Harding is a 33 y.o. female.   33 year old female presents urgent care with complaints of right lower back pain and right lateral hip pain.  This has been going on for about 2 weeks now.  She denies any injury to the area.  She reports that movement increases the pain.  Lying down and sitting up causes increased pain in the back.  Elevating the leg causes increased pain in the lateral hip.  She denies any radiating pain down the leg.  She denies any bowel or bladder incontinence, dysuria, hematuria, constipation.   Back Pain Associated symptoms: no abdominal pain, no chest pain, no dysuria and no fever     Past Medical History:  Diagnosis Date   Amenorrhea    Dysmenorrhea     Patient Active Problem List   Diagnosis Date Noted   Irregular periods 06/04/2019   History of hidradenitis suppurativa 06/04/2019   Primary oligomenorrhea 06/04/2019   BMI 37.0-37.9, adult 06/04/2019   PCOS (polycystic ovarian syndrome) 06/04/2019    History reviewed. No pertinent surgical history.  OB History     Gravida  0   Para  0   Term  0   Preterm  0   AB  0   Living  0      SAB  0   IAB  0   Ectopic  0   Multiple  0   Live Births  0            Home Medications    Prior to Admission medications   Medication Sig Start Date End Date Taking? Authorizing Provider  cyclobenzaprine (FLEXERIL) 5 MG tablet Take 1 tablet (5 mg total) by mouth every 8 (eight) hours as needed for muscle spasms. 12/20/23  Yes Orlene Salmons A, PA-C  predniSONE  (DELTASONE ) 20 MG tablet Take 2 tablets (40 mg total) by mouth daily with breakfast for 5 days. 12/20/23 12/25/23 Yes [place] A, PA-C  meloxicam  (MOBIC ) 15 MG tablet Take 1 tablet (15 mg total) by mouth daily. 07/05/22    Jason Leita Repine, FNP  venlafaxine  XR (EFFEXOR  XR) 37.5 MG 24 hr capsule Take 1 capsule (37.5 mg total) by mouth daily with breakfast. 05/09/23   Saguier, [place], PA-C  venlafaxine  XR (EFFEXOR -XR) 37.5 MG 24 hr capsule TAKE 1 CAPSULE(37.5 MG) BY MOUTH DAILY WITH BREAKFAST 06/16/23   Saguier, [place], PA-C    Family History Family History  Problem Relation Age of Onset   Migraines Mother    Breast cancer Maternal Grandmother 70   Cancer Maternal Grandmother    Hypertension Maternal Grandmother    Heart failure Paternal Grandmother     Social History Social History   Tobacco Use   Smoking status: Never   Smokeless tobacco: Never  Vaping Use   Vaping status: Never Used  Substance Use Topics   Alcohol use: Yes    Comment: occ   Drug use: Never     Allergies   Patient has no known allergies.   Review of Systems Review of Systems  Constitutional:  Negative for chills and fever.  HENT:  Negative for ear pain and sore throat.   Eyes:  Negative for pain and visual disturbance.  Respiratory:  Negative for  cough and shortness of breath.   Cardiovascular:  Negative for chest pain and palpitations.  Gastrointestinal:  Negative for abdominal pain and vomiting.  Genitourinary:  Negative for dysuria and hematuria.  Musculoskeletal:  Positive for back pain. Negative for arthralgias.       Right lateral hip pain  Skin:  Negative for color change and rash.  Neurological:  Negative for seizures and syncope.  All other systems reviewed and are negative.    Physical Exam Triage Vital Signs ED Triage Vitals  Encounter Vitals Group     BP 12/20/23 1111 124/64     Girls Systolic BP Percentile --      Girls Diastolic BP Percentile --      Boys Systolic BP Percentile --      Boys Diastolic BP Percentile --      Pulse Rate 12/20/23 1111 82     Resp 12/20/23 1111 16     Temp 12/20/23 1111 98.8 F (37.1 C)     Temp Source 12/20/23 1111 Oral     SpO2 12/20/23 1111 98 %     Weight  --      Height --      Head Circumference --      Peak Flow --      Pain Score 12/20/23 1108 6     Pain Loc --      Pain Education --      Exclude from Growth Chart --    No data found.  Updated Vital Signs BP 124/64 (BP Location: Left Arm)   Pulse 82   Temp 98.8 F (37.1 C) (Oral)   Resp 16   LMP 11/26/2023   SpO2 98%   Visual Acuity Right Eye Distance:   Left Eye Distance:   Bilateral Distance:    Right Eye Near:   Left Eye Near:    Bilateral Near:     Physical Exam Vitals and nursing note reviewed.  Constitutional:      General: She is not in acute distress.    Appearance: She is well-developed.  HENT:     Head: Normocephalic and atraumatic.  Eyes:     Conjunctiva/sclera: Conjunctivae normal.  Cardiovascular:     Rate and Rhythm: Normal rate and regular rhythm.     Heart sounds: No murmur heard. Pulmonary:     Effort: Pulmonary effort is normal. No respiratory distress.     Breath sounds: Normal breath sounds.  Abdominal:     Palpations: Abdomen is soft.     Tenderness: There is no abdominal tenderness.  Musculoskeletal:        General: No swelling.     Cervical back: Neck supple.     Lumbar back: Spasms and tenderness present. Negative right straight leg raise test and negative left straight leg raise test.       Back:       Legs:  Skin:    General: Skin is warm and dry.     Capillary Refill: Capillary refill takes less than 2 seconds.  Neurological:     Mental Status: She is alert.  Psychiatric:        Mood and Affect: Mood normal.      UC Treatments / Results  Labs (all labs ordered are listed, but only abnormal results are displayed) Labs Reviewed - No data to display  EKG   Radiology DG Hip Unilat With Pelvis 2-3 Views Right Result Date: 12/20/2023 EXAM: 3 VIEW(S) XRAY OF THE RIGHT HIP 12/20/2023 11:57:08  AM COMPARISON: None available. CLINICAL HISTORY: Right hip pain 221993 FINDINGS: BONES AND JOINTS: No acute fracture or focal  osseous lesion. Lateral acetabular spurring with relative preservation of joint space. SOFT TISSUES: The soft tissues are unremarkable. IMPRESSION: 1. Lateral acetabular spurring with relative preservation of joint space. Electronically signed by: Lonni Necessary MD 12/20/2023 12:34 PM EDT RP Workstation: [id number]   DG Lumbar Spine Complete Result Date: 12/20/2023 EXAM: 4 VIEW(S) XRAY OF THE LUMBAR SPINE 12/20/2023 11:56:54 AM COMPARISON: 07/05/2022 CLINICAL HISTORY: low back pain on right and right lateral hip pain. low back pain on right and right lateral hip pain FINDINGS: LUMBAR SPINE: BONES: No acute fracture. No aggressive appearing osseous lesion. Alignment is normal. DISCS AND DEGENERATIVE CHANGES: Degenerative endplate changes and disc disease are again noted at L4-L5 and L5-S1. SOFT TISSUES: No acute abnormality. IMPRESSION: 1. No acute lumbar spine abnormality identified. Electronically signed by: Lonni Necessary MD 12/20/2023 12:33 PM EDT RP Workstation: [id number]    Procedures Procedures (including critical care time)  Medications Ordered in UC Medications - No data to display  Initial Impression / Assessment and Plan / UC Course  I have reviewed the triage vital signs and the nursing notes.  Pertinent labs & imaging results that were available during my care of the patient were reviewed by me and considered in my medical decision making (see chart for details).     Acute left-sided low back pain without sciatica - Plan: DG Lumbar Spine Complete, DG Lumbar Spine Complete  Right hip pain - Plan: DG Hip Unilat With Pelvis 2-3 Views Right, DG Hip Unilat With Pelvis 2-3 Views Right  It band syndrome, right   X-ray of the lower back and right hip done today.  Final evaluation by the radiologist is still pending but on brief evaluation there does not appear to be any acute changes.  Symptoms in the back are likely secondary to muscle spasm.  We can treat this with a muscle  relaxer.  Symptoms on the right hip are likely secondary to a mild inflammation of the IT band.  This can be treated with a stronger anti-inflammatory.  We will treat both conditions with the following: Prednisone  40 mg (2 tablets) once daily for 5 days. Take this in the morning.  This is a steroid to help with inflammation and pain.  Do not take ibuprofen while you are taking this medication.  It is okay to take Tylenol. Flexeril 5 mg every 8 hours as needed for muscle spasms.  Use caution as this medication can cause drowsiness. Light stretching to improve mobility May alternate heat and ice for symptom relief Return to urgent care or PCP if symptoms worsen or fail to resolve.   Final Clinical Impressions(s) / UC Diagnoses   Final diagnoses:  Acute left-sided low back pain without sciatica  Right hip pain  It band syndrome, right     Discharge Instructions      X-ray of the lower back and right hip done today.  Final evaluation by the radiologist is still pending but on brief evaluation there does not appear to be any acute changes.  Symptoms in the back are likely secondary to muscle spasm.  We can treat this with a muscle relaxer.  Symptoms on the right hip are likely secondary to a mild inflammation of the IT band.  This can be treated with a stronger anti-inflammatory.  We will treat both conditions with the following: Prednisone  40 mg (2 tablets) once daily for 5  days. Take this in the morning.  This is a steroid to help with inflammation and pain.  Do not take ibuprofen while you are taking this medication.  It is okay to take Tylenol. Flexeril 5 mg every 8 hours as needed for muscle spasms.  Use caution as this medication can cause drowsiness. Light stretching to improve mobility May alternate heat and ice for symptom relief Return to urgent care or PCP if symptoms worsen or fail to resolve.       ED Prescriptions     Medication Sig Dispense Auth. Provider   predniSONE   (DELTASONE ) 20 MG tablet Take 2 tablets (40 mg total) by mouth daily with breakfast for 5 days. 10 tablet Sussie Minor A, PA-C   cyclobenzaprine (FLEXERIL) 5 MG tablet Take 1 tablet (5 mg total) by mouth every 8 (eight) hours as needed for muscle spasms. 30 tablet Teresa Almarie LABOR, [place]      PDMP not reviewed this encounter.   Teresa Almarie LABOR, PA-C 12/20/23 1237

## 2023-12-20 NOTE — Discharge Instructions (Addendum)
 X-ray of the lower back and right hip done today.  Final evaluation by the radiologist is still pending but on brief evaluation there does not appear to be any acute changes.  Symptoms in the back are likely secondary to muscle spasm.  We can treat this with a muscle relaxer.  Symptoms on the right hip are likely secondary to a mild inflammation of the IT band.  This can be treated with a stronger anti-inflammatory.  We will treat both conditions with the following: Prednisone  40 mg (2 tablets) once daily for 5 days. Take this in the morning.  This is a steroid to help with inflammation and pain.  Do not take ibuprofen while you are taking this medication.  It is okay to take Tylenol. Flexeril 5 mg every 8 hours as needed for muscle spasms.  Use caution as this medication can cause drowsiness. Light stretching to improve mobility May alternate heat and ice for symptom relief Return to urgent care or PCP if symptoms worsen or fail to resolve.

## 2023-12-24 ENCOUNTER — Telehealth (HOSPITAL_COMMUNITY): Payer: Self-pay

## 2023-12-24 NOTE — Telephone Encounter (Signed)
 Pt called asking about x-ray results from 10/11 visit. Advised of results. Questions answered. Assisted with setting up new PCP.

## 2023-12-25 ENCOUNTER — Telehealth: Payer: Self-pay | Admitting: Medical

## 2023-12-25 NOTE — Telephone Encounter (Unsigned)
 Copied from CRM [id number]. Topic: Clinical - Medication Refill >> Dec 25, 2023  2:22 PM Franky GRADE wrote: Medication: predniSONE  (DELTASONE ) 20 MG tablet [[id number]]  Has the patient contacted their pharmacy? No (Agent: If no, request that the patient contact the pharmacy for the refill. If patient does not wish to contact the pharmacy document the reason why and proceed with request.) (Agent: If yes, when and what did the pharmacy advise?)  This is the patient's preferred pharmacy:  [id number] [place], [place] - [address] AT [facility] [address] [facility] [address] [place] [address] Phone: [phone] Fax: [phone]   Is this the correct pharmacy for this prescription? Yes If no, delete pharmacy and type the correct one.   Has the prescription been filled recently? Yes  Is the patient out of the medication? Yes, she only received a 5 day supply.   Has the patient been seen for an appointment in the last year OR does the patient have an upcoming appointment? Yes  Can we respond through MyChart? Yes  Agent: Please be advised that Rx refills may take up to 3 business days. We ask that you follow-up with your pharmacy.

## 2024-01-08 ENCOUNTER — Ambulatory Visit (HOSPITAL_BASED_OUTPATIENT_CLINIC_OR_DEPARTMENT_OTHER): Payer: PRIVATE HEALTH INSURANCE | Admitting: Family Medicine
